# Patient Record
Sex: Female | Born: 1950 | Race: Black or African American | Hispanic: No | Marital: Married | Smoking: Never smoker
Health system: Southern US, Community
[De-identification: ages and names within clinical notes are randomized; demographics above are authoritative.]

## PROBLEM LIST (undated history)

## (undated) DIAGNOSIS — R300 Dysuria: Secondary | ICD-10-CM

## (undated) DIAGNOSIS — E785 Hyperlipidemia, unspecified: Secondary | ICD-10-CM

## (undated) DIAGNOSIS — R319 Hematuria, unspecified: Secondary | ICD-10-CM

## (undated) DIAGNOSIS — E039 Hypothyroidism, unspecified: Secondary | ICD-10-CM

## (undated) DIAGNOSIS — Z9289 Personal history of other medical treatment: Secondary | ICD-10-CM

## (undated) DIAGNOSIS — E079 Disorder of thyroid, unspecified: Secondary | ICD-10-CM

## (undated) DIAGNOSIS — K5900 Constipation, unspecified: Secondary | ICD-10-CM

## (undated) DIAGNOSIS — J40 Bronchitis, not specified as acute or chronic: Secondary | ICD-10-CM

## (undated) DIAGNOSIS — I10 Essential (primary) hypertension: Secondary | ICD-10-CM

## (undated) HISTORY — DX: Bronchitis, not specified as acute or chronic: J40

## (undated) HISTORY — DX: Hematuria, unspecified: R31.9

## (undated) HISTORY — DX: Dysuria: R30.0

## (undated) HISTORY — DX: Constipation, unspecified: K59.00

## (undated) HISTORY — PX: OTHER SURGICAL HISTORY: SHX169

## (undated) HISTORY — PX: TUBAL LIGATION: SHX77

---

## 2001-06-17 ENCOUNTER — Ambulatory Visit (HOSPITAL_COMMUNITY): Admission: RE | Admit: 2001-06-17 | Discharge: 2001-06-17 | Payer: Self-pay | Admitting: Obstetrics and Gynecology

## 2001-06-17 ENCOUNTER — Encounter: Payer: Self-pay | Admitting: Obstetrics and Gynecology

## 2001-08-06 ENCOUNTER — Other Ambulatory Visit: Admission: RE | Admit: 2001-08-06 | Discharge: 2001-08-06 | Payer: Self-pay | Admitting: Obstetrics and Gynecology

## 2002-01-08 ENCOUNTER — Encounter: Payer: Self-pay | Admitting: Internal Medicine

## 2002-01-08 ENCOUNTER — Ambulatory Visit (HOSPITAL_COMMUNITY): Admission: RE | Admit: 2002-01-08 | Discharge: 2002-01-08 | Payer: Self-pay | Admitting: Internal Medicine

## 2002-01-13 ENCOUNTER — Ambulatory Visit (HOSPITAL_COMMUNITY): Admission: RE | Admit: 2002-01-13 | Discharge: 2002-01-13 | Payer: Self-pay | Admitting: Internal Medicine

## 2002-01-13 ENCOUNTER — Encounter: Payer: Self-pay | Admitting: Internal Medicine

## 2002-06-18 ENCOUNTER — Encounter: Payer: Self-pay | Admitting: Internal Medicine

## 2002-06-18 ENCOUNTER — Ambulatory Visit (HOSPITAL_COMMUNITY): Admission: RE | Admit: 2002-06-18 | Discharge: 2002-06-18 | Payer: Self-pay | Admitting: Internal Medicine

## 2003-06-22 ENCOUNTER — Ambulatory Visit (HOSPITAL_COMMUNITY): Admission: RE | Admit: 2003-06-22 | Discharge: 2003-06-22 | Payer: Self-pay | Admitting: Obstetrics and Gynecology

## 2003-06-22 ENCOUNTER — Encounter: Payer: Self-pay | Admitting: Obstetrics and Gynecology

## 2003-09-09 ENCOUNTER — Ambulatory Visit (HOSPITAL_COMMUNITY): Admission: RE | Admit: 2003-09-09 | Discharge: 2003-09-09 | Payer: Self-pay | Admitting: Family Medicine

## 2004-06-23 ENCOUNTER — Ambulatory Visit (HOSPITAL_COMMUNITY): Admission: RE | Admit: 2004-06-23 | Discharge: 2004-06-23 | Payer: Self-pay | Admitting: Family Medicine

## 2005-01-04 ENCOUNTER — Ambulatory Visit (HOSPITAL_COMMUNITY): Admission: RE | Admit: 2005-01-04 | Discharge: 2005-01-04 | Payer: Self-pay | Admitting: General Surgery

## 2005-06-30 ENCOUNTER — Ambulatory Visit (HOSPITAL_COMMUNITY): Admission: RE | Admit: 2005-06-30 | Discharge: 2005-06-30 | Payer: Self-pay | Admitting: Family Medicine

## 2006-07-03 ENCOUNTER — Ambulatory Visit (HOSPITAL_COMMUNITY): Admission: RE | Admit: 2006-07-03 | Discharge: 2006-07-03 | Payer: Self-pay | Admitting: Family Medicine

## 2006-12-13 ENCOUNTER — Ambulatory Visit (HOSPITAL_COMMUNITY): Admission: RE | Admit: 2006-12-13 | Discharge: 2006-12-13 | Payer: Self-pay | Admitting: Obstetrics & Gynecology

## 2007-07-08 ENCOUNTER — Ambulatory Visit (HOSPITAL_COMMUNITY): Admission: RE | Admit: 2007-07-08 | Discharge: 2007-07-08 | Payer: Self-pay | Admitting: Family Medicine

## 2008-07-07 ENCOUNTER — Ambulatory Visit (HOSPITAL_COMMUNITY): Admission: RE | Admit: 2008-07-07 | Discharge: 2008-07-07 | Payer: Self-pay | Admitting: Family Medicine

## 2008-12-28 ENCOUNTER — Other Ambulatory Visit: Admission: RE | Admit: 2008-12-28 | Discharge: 2008-12-28 | Payer: Self-pay | Admitting: Obstetrics and Gynecology

## 2009-07-14 ENCOUNTER — Ambulatory Visit (HOSPITAL_COMMUNITY): Admission: RE | Admit: 2009-07-14 | Discharge: 2009-07-14 | Payer: Self-pay | Admitting: Family Medicine

## 2010-07-15 ENCOUNTER — Ambulatory Visit (HOSPITAL_COMMUNITY): Admission: RE | Admit: 2010-07-15 | Discharge: 2010-07-15 | Payer: Self-pay | Admitting: Family Medicine

## 2011-06-30 ENCOUNTER — Other Ambulatory Visit (HOSPITAL_COMMUNITY): Payer: Self-pay | Admitting: Family Medicine

## 2011-06-30 DIAGNOSIS — Z139 Encounter for screening, unspecified: Secondary | ICD-10-CM

## 2011-07-17 ENCOUNTER — Ambulatory Visit (HOSPITAL_COMMUNITY)
Admission: RE | Admit: 2011-07-17 | Discharge: 2011-07-17 | Disposition: A | Discharge status: Home / Self Care | Payer: Self-pay | Source: Ambulatory Visit | Attending: Family Medicine | Admitting: Family Medicine

## 2011-07-17 DIAGNOSIS — Z139 Encounter for screening, unspecified: Secondary | ICD-10-CM

## 2011-11-01 ENCOUNTER — Emergency Department (HOSPITAL_COMMUNITY): Payer: Self-pay

## 2011-11-01 ENCOUNTER — Encounter: Payer: Self-pay | Admitting: Oncology

## 2011-11-01 ENCOUNTER — Emergency Department (HOSPITAL_COMMUNITY)
Admission: EM | Admit: 2011-11-01 | Discharge: 2011-11-01 | Disposition: A | Discharge status: Home / Self Care | Payer: Self-pay | Attending: Emergency Medicine | Admitting: Emergency Medicine

## 2011-11-01 DIAGNOSIS — Q676 Pectus excavatum: Secondary | ICD-10-CM | POA: Insufficient documentation

## 2011-11-01 DIAGNOSIS — Z79899 Other long term (current) drug therapy: Secondary | ICD-10-CM | POA: Insufficient documentation

## 2011-11-01 DIAGNOSIS — I10 Essential (primary) hypertension: Secondary | ICD-10-CM | POA: Insufficient documentation

## 2011-11-01 DIAGNOSIS — E079 Disorder of thyroid, unspecified: Secondary | ICD-10-CM | POA: Insufficient documentation

## 2011-11-01 DIAGNOSIS — E785 Hyperlipidemia, unspecified: Secondary | ICD-10-CM | POA: Insufficient documentation

## 2011-11-01 DIAGNOSIS — W108XXA Fall (on) (from) other stairs and steps, initial encounter: Secondary | ICD-10-CM | POA: Insufficient documentation

## 2011-11-01 HISTORY — DX: Hyperlipidemia, unspecified: E78.5

## 2011-11-01 HISTORY — DX: Essential (primary) hypertension: I10

## 2011-11-01 HISTORY — DX: Disorder of thyroid, unspecified: E07.9

## 2011-11-01 HISTORY — DX: Personal history of other medical treatment: Z92.89

## 2011-11-01 NOTE — ED Notes (Addendum)
Pt reports x 2 weeks she has had a palpable mass b/w her breasts - seen by pcp for same and was told it was "her bone."  Pt reports pain worsens w/ movement and radiates through to her shoulder blades.  Also reports abdominal pain.

## 2011-11-01 NOTE — ED Provider Notes (Signed)
History     CSN: [id number]  Arrival date & time 11/01/11  1137   First MD Initiated Contact with Patient 11/01/11 1304      Chief Complaint  Patient presents with  . Knot on sternum     (Consider location/radiation/quality/duration/timing/severity/associated sxs/prior treatment) HPI  Patient relates in the first part of November she fell about 4 steps landing on her knees. She denies hitting her chest or having any chest discomfort after that. She also relates she helped her husband unload a load of wood from a truck and she's been carrying wood into the house for their wood stove. She states 2 or 3 weeks ago she noticed a lump in her central lower chest that is nonpainful and nontender to palpation. Nothing makes it hurt and nothing makes it go away. She denies any cough or fever. She states she was seen last week by her physician and was told it was a bulb. She relates however today she thought it seemed a little more swollen and presents to the emergency department should states she's afraid it is a sign of  a heart problem when I tried to reassure her that this was not breast cancer.  PCP Dr. Delbert Harness  Past Medical History  Diagnosis Date  . Hypertension   . Thyroid disease   . Hyperlipidemia   . History of mammogram     Past Surgical History  Procedure Date  . Exploratory laparotomy   . Tubal ligation     No family history on file.  History  Substance Use Topics  . Smoking status: Never Smoker   . Smokeless tobacco: Not on file  . Alcohol Use: No   lives with spouse Unemployed  OB History    Grav Para Term Preterm Abortions TAB SAB Ect Mult Living                  Review of Systems  All other systems reviewed and are negative.    Allergies  Fish allergy and Other  Home Medications   Current Outpatient Rx  Name Route Sig Dispense Refill  . ATENOLOL 50 MG PO TABS Oral Take 50 mg by mouth daily.      . IBUPROFEN 200 MG PO TABS Oral Take 400 mg by  mouth every 6 (six) hours as needed. Pain     . LEVOTHYROXINE SODIUM 50 MCG PO TABS Oral Take 50 mcg by mouth daily.      Marland Kitchen LISINOPRIL-HYDROCHLOROTHIAZIDE 20-12.5 MG PO TABS Oral Take 1 tablet by mouth daily.      Marland Kitchen SIMVASTATIN 40 MG PO TABS Oral Take 40 mg by mouth at bedtime.      Marland Kitchen VERAPAMIL HCL 240 MG PO TBCR Oral Take 240 mg by mouth daily.        BP 138/72  Pulse 54  Temp 98.3 F (36.8 C)  Resp 16  Ht 5\' 3"  (1.6 m)  Wt 141 lb (63.957 kg)  BMI 24.98 kg/m2  SpO2 100%  LMP 10/31/2009  Vital signs normal except for bradycardia consistent with her medications   Physical Exam  Nursing note and vitals reviewed. Constitutional: She is oriented to person, place, and time. She appears well-developed and well-nourished.  Non-toxic appearance. She does not appear ill. No distress.  HENT:  Head: Normocephalic and atraumatic.  Right Ear: External ear normal.  Left Ear: External ear normal.  Nose: Nose normal. No mucosal edema or rhinorrhea.  Mouth/Throat: Oropharynx is clear and moist and mucous  membranes are normal. No dental abscesses or uvula swelling.  Eyes: Conjunctivae and EOM are normal. Pupils are equal, round, and reactive to light.  Neck: Normal range of motion and full passive range of motion without pain. Neck supple.  Cardiovascular: Normal rate, regular rhythm and normal heart sounds.  Exam reveals no gallop and no friction rub.   No murmur heard. Pulmonary/Chest: Effort normal and breath sounds normal. No respiratory distress. She has no wheezes. She has no rhonchi. She has no rales. She exhibits no tenderness and no crepitus.       Patient seems to indicate her xiphoid is in the area she is concerned about. There may be a small amount of swelling over it however the skin is not red, there is no induration, skin lesions seen. She is nontender to palpation  Abdominal: Soft. Normal appearance and bowel sounds are normal. She exhibits no distension. There is no tenderness.  There is no rebound and no guarding.  Musculoskeletal: Normal range of motion. She exhibits no edema and no tenderness.       Moves all extremities well.  Neurological: She is alert and oriented to person, place, and time. She has normal strength. No cranial nerve deficit.  Skin: Skin is warm, dry and intact. No rash noted. No erythema. No pallor.  Psychiatric: She has a normal mood and affect. Her speech is normal and behavior is normal. Her mood appears not anxious.    ED Course  Procedures (including critical care time)  Dg Sternum  11/01/2011  *RADIOLOGY REPORT*  Clinical Data: Prominent xyphoid process.  Clinical concern for a possible fracture.  STERNUM - 2+ VIEW  Comparison: Chest CT dated 01/13/2002.  Findings: Mild pectus excavatum inferiorly.  No fracture or subluxation seen.  Normal sized heart and clear lungs.  Mild peribronchial thickening.  IMPRESSION:  1.  Mild pectus excavatum without fracture. 2.  Mild bronchitic changes.  Original Report Authenticated By: Darrol Angel, M.D.   Diagnoses that have been ruled out:  Diagnoses that are still under consideration:  Final diagnoses:  Pectus excavatum   Plan discharge with reassurance  Devoria Albe, MD, FACEP    MDM          Ward Givens, MD 11/01/11 1450

## 2012-08-15 ENCOUNTER — Other Ambulatory Visit (HOSPITAL_COMMUNITY): Payer: Self-pay | Admitting: Family Medicine

## 2012-08-15 DIAGNOSIS — Z1231 Encounter for screening mammogram for malignant neoplasm of breast: Secondary | ICD-10-CM

## 2012-08-20 ENCOUNTER — Ambulatory Visit (HOSPITAL_COMMUNITY)
Admission: RE | Admit: 2012-08-20 | Discharge: 2012-08-20 | Disposition: A | Discharge status: Home / Self Care | Payer: Self-pay | Source: Ambulatory Visit | Attending: Family Medicine | Admitting: Family Medicine

## 2012-08-20 DIAGNOSIS — Z1231 Encounter for screening mammogram for malignant neoplasm of breast: Secondary | ICD-10-CM

## 2013-04-08 ENCOUNTER — Ambulatory Visit (INDEPENDENT_AMBULATORY_CARE_PROVIDER_SITE_OTHER): Payer: Self-pay | Admitting: Obstetrics & Gynecology

## 2013-04-08 ENCOUNTER — Encounter: Payer: Self-pay | Admitting: Obstetrics & Gynecology

## 2013-04-08 VITALS — BP 100/70 | Ht 63.0 in | Wt 144.0 lb

## 2013-04-08 DIAGNOSIS — Z01419 Encounter for gynecological examination (general) (routine) without abnormal findings: Secondary | ICD-10-CM

## 2013-04-08 DIAGNOSIS — I10 Essential (primary) hypertension: Secondary | ICD-10-CM

## 2013-04-08 DIAGNOSIS — Z1212 Encounter for screening for malignant neoplasm of rectum: Secondary | ICD-10-CM

## 2013-04-08 NOTE — Progress Notes (Signed)
Patient ID: Jenna Wade, female   DOB: 1951-08-18, 61 y.o.   MRN: [id number] Subjective:     Jenna Wade is a 62 y.o. female here for a routine exam.  Patient's last menstrual period was 10/31/2009. No obstetric history on file. Current complaints: none.  Personal health questionnaire reviewed: no.   Gynecologic History Patient's last menstrual period was 10/31/2009. Contraception: post menopausal status Last Pap: 2013. Results were: normal Last mammogram: 2013. Results were: normal  Obstetric History OB History   Grav Para Term Preterm Abortions TAB SAB Ect Mult Living                   The following portions of the patient's history were reviewed and updated as appropriate: allergies, current medications, past family history, past medical history, past social history, past surgical history and problem list.  Review of Systems  Review of Systems  Constitutional: Negative for fever, chills, weight loss, malaise/fatigue and diaphoresis.  HENT: Negative for hearing loss, ear pain, nosebleeds, congestion, sore throat, neck pain, tinnitus and ear discharge.   Eyes: Negative for blurred vision, double vision, photophobia, pain, discharge and redness.  Respiratory: Negative for cough, hemoptysis, sputum production, shortness of breath, wheezing and stridor.   Cardiovascular: Negative for chest pain, palpitations, orthopnea, claudication, leg swelling and PND.  Gastrointestinal: negative for abdominal pain. Negative for heartburn, nausea, vomiting, diarrhea, constipation, blood in stool and melena.  Genitourinary: Negative for dysuria, urgency, frequency, hematuria and flank pain.  Musculoskeletal: Negative for myalgias, back pain, joint pain and falls.  Skin: Negative for itching and rash.  Neurological: Negative for dizziness, tingling, tremors, sensory change, speech change, focal weakness, seizures, loss of consciousness, weakness and headaches.  Endo/Heme/Allergies: Negative for  environmental allergies and polydipsia. Does not bruise/bleed easily.  Psychiatric/Behavioral: Negative for depression, suicidal ideas, hallucinations, memory loss and substance abuse. The patient is not nervous/anxious and does not have insomnia.        Objective:    Physical Exam  Vitals reviewed. Constitutional: She is oriented to person, place, and time. She appears well-developed and well-nourished.  HENT:  Head: Normocephalic and atraumatic.        Right Ear: External ear normal.  Left Ear: External ear normal.  Nose: Nose normal.  Mouth/Throat: Oropharynx is clear and moist.  Eyes: Conjunctivae and EOM are normal. Pupils are equal, round, and reactive to light. Right eye exhibits no discharge. Left eye exhibits no discharge. No scleral icterus.  Neck: Normal range of motion. Neck supple. No tracheal deviation present. No thyromegaly present.  Cardiovascular: Normal rate, regular rhythm, normal heart sounds and intact distal pulses.  Exam reveals no gallop and no friction rub.   No murmur heard. Respiratory: Effort normal and breath sounds normal. No respiratory distress. She has no wheezes. She has no rales. She exhibits no tenderness.  GI: Soft. Bowel sounds are normal. She exhibits no distension and no mass. There is no tenderness. There is no rebound and no guarding.  Genitourinary:       Vulva is normal without lesions Vagina is pink moist without discharge Cervix normal in appearance and pap is done Uterus is normal size shape and contour Adnexa is negative with normal sized ovaries   Musculoskeletal: Normal range of motion. She exhibits no edema and no tenderness.  Neurological: She is alert and oriented to person, place, and time. She has normal reflexes. She displays normal reflexes. No cranial nerve deficit. She exhibits normal muscle tone. Coordination normal.  Skin:  Skin is warm and dry. No rash noted. No erythema. No pallor.  Psychiatric: She has a normal mood and  affect. Her behavior is normal. Judgment and thought content normal.       Assessment:    Healthy female exam.    Plan:    Mammogram ordered. Follow up in: 1 year.

## 2013-07-29 ENCOUNTER — Encounter: Payer: Self-pay | Admitting: Women's Health

## 2013-07-29 ENCOUNTER — Ambulatory Visit (INDEPENDENT_AMBULATORY_CARE_PROVIDER_SITE_OTHER): Payer: Self-pay | Admitting: Women's Health

## 2013-07-29 VITALS — BP 120/80 | Ht 63.0 in | Wt 144.0 lb

## 2013-07-29 DIAGNOSIS — N390 Urinary tract infection, site not specified: Secondary | ICD-10-CM

## 2013-07-29 DIAGNOSIS — B373 Candidiasis of vulva and vagina: Secondary | ICD-10-CM

## 2013-07-29 MED ORDER — NITROFURANTOIN MONOHYD MACRO 100 MG PO CAPS: 100.0000 mg | ORAL_CAPSULE | Freq: Two times a day (BID) | ORAL | Status: DC

## 2013-07-29 NOTE — Patient Instructions (Addendum)
Urinary Tract Infection  Urinary tract infections (UTIs) can develop anywhere along your urinary tract. Your urinary tract is your body's drainage system for removing wastes and extra water. Your urinary tract includes two kidneys, two ureters, a bladder, and a urethra. Your kidneys are a pair of bean-shaped organs. Each kidney is about the size of your fist. They are located below your ribs, one on each side of your spine.  CAUSES  Infections are caused by microbes, which are microscopic organisms, including fungi, viruses, and bacteria. These organisms are so small that they can only be seen through a microscope. Bacteria are the microbes that most commonly cause UTIs.  SYMPTOMS   Symptoms of UTIs may vary by age and gender of the patient and by the location of the infection. Symptoms in young women typically include a frequent and intense urge to urinate and a painful, burning feeling in the bladder or urethra during urination. Older women and men are more likely to be tired, shaky, and weak and have muscle aches and abdominal pain. A fever may mean the infection is in your kidneys. Other symptoms of a kidney infection include pain in your back or sides below the ribs, nausea, and vomiting.  DIAGNOSIS  To diagnose a UTI, your caregiver will ask you about your symptoms. Your caregiver also will ask to provide a urine sample. The urine sample will be tested for bacteria and white blood cells. White blood cells are made by your body to help fight infection.  TREATMENT   Typically, UTIs can be treated with medication. Because most UTIs are caused by a bacterial infection, they usually can be treated with the use of antibiotics. The choice of antibiotic and length of treatment depend on your symptoms and the type of bacteria causing your infection.  HOME CARE INSTRUCTIONS   If you were prescribed antibiotics, take them exactly as your caregiver instructs you. Finish the medication even if you feel better after you  have only taken some of the medication.   Drink enough water and fluids to keep your urine clear or pale yellow.   Avoid caffeine, tea, and carbonated beverages. They tend to irritate your bladder.   Empty your bladder often. Avoid holding urine for long periods of time.   Empty your bladder before and after sexual intercourse.   After a bowel movement, women should cleanse from front to back. Use each tissue only once.  SEEK MEDICAL CARE IF:    You have back pain.   You develop a fever.   Your symptoms do not begin to resolve within 3 days.  SEEK IMMEDIATE MEDICAL CARE IF:    You have severe back pain or lower abdominal pain.   You develop chills.   You have nausea or vomiting.   You have continued burning or discomfort with urination.  MAKE SURE YOU:    Understand these instructions.   Will watch your condition.   Will get help right away if you are not doing well or get worse.  Document Released: 07/26/2005 Document Revised: 04/16/2012 Document Reviewed: 11/24/2011  Exit[employer] Patient Information 2014 [employer].

## 2013-07-29 NOTE — Progress Notes (Signed)
Patient ID: Jenna Wade, female   DOB: 10-Jun-1951, 62 y.o.   MRN: [id number] Pt states she has an odor when she voids. Pt also states that she has noticed a tingling sensation as well. Pt denies any discharge or irritation. Pt missed the cup when trying to get a sample, unable to check urine.

## 2013-07-29 NOTE — Addendum Note (Signed)
Addended by: Shawna Clamp R on: 07/29/2013 12:09 PM   Modules accepted: Orders

## 2013-07-29 NOTE — Progress Notes (Signed)
Patient ID: Jenna Wade, female   DOB: 08-25-1951, 62 y.o.   MRN: [id number] Jenna Wade is a 62 y.o. African-American female who reports low back pain when voiding, odor to urine, and tingling sensation after voiding x 4d. States she has had a UTI in the past, and this feels the same.  Denies fever/chills, flank pain, abdominal pain, abnormal/malodorous vag d/c, or vulvovaginal itching/irritation.  States she usually gets yeast infections from antibiotics.   O: BP 120/80  Ht 5\' 3"  (1.6 m)  Wt 144 lb (65.318 kg)  BMI 25.51 kg/m2  LMP 10/31/2009      Urine w/ + nitrites, not yet entered into EPIC by nurse  A: UTI  P: Rx Macrobid BID x 7d     Send urine for UA, C&S     Reviewed UTI prevention/relief measures     Unable to rx diflucan d/t contraindication w/ simvastatin, pt to eat lots of yogurt w/ live cultures, and use OTC yeast   infection cream if needed  Cheral Marker, CNM, [facility] 07/29/2013 11:49 AM

## 2013-07-30 ENCOUNTER — Other Ambulatory Visit (HOSPITAL_COMMUNITY): Payer: Self-pay | Admitting: Family Medicine

## 2013-07-30 DIAGNOSIS — Z1231 Encounter for screening mammogram for malignant neoplasm of breast: Secondary | ICD-10-CM

## 2013-07-30 LAB — URINALYSIS
Ketones, ur: NEGATIVE mg/dL
Nitrite: POSITIVE — AB
Specific Gravity, Urine: <1.005 — ABNORMAL LOW (ref 1.005–1.030)
Urobilinogen, UA: 0.2 mg/dL (ref 0.0–1.0)

## 2013-07-31 LAB — URINE CULTURE

## 2013-08-11 ENCOUNTER — Ambulatory Visit (HOSPITAL_COMMUNITY)
Admission: RE | Admit: 2013-08-11 | Discharge: 2013-08-11 | Disposition: A | Discharge status: Home / Self Care | Payer: Self-pay | Source: Ambulatory Visit | Attending: Family Medicine | Admitting: Family Medicine

## 2013-08-11 DIAGNOSIS — Z1231 Encounter for screening mammogram for malignant neoplasm of breast: Secondary | ICD-10-CM

## 2014-01-31 ENCOUNTER — Emergency Department (HOSPITAL_COMMUNITY)
Admission: EM | Admit: 2014-01-31 | Discharge: 2014-01-31 | Disposition: A | Discharge status: Home / Self Care | Payer: BC Managed Care – PPO | Attending: Emergency Medicine | Admitting: Emergency Medicine

## 2014-01-31 ENCOUNTER — Encounter (HOSPITAL_COMMUNITY): Payer: Self-pay | Admitting: Emergency Medicine

## 2014-01-31 ENCOUNTER — Emergency Department (HOSPITAL_COMMUNITY): Payer: BC Managed Care – PPO

## 2014-01-31 DIAGNOSIS — E785 Hyperlipidemia, unspecified: Secondary | ICD-10-CM | POA: Insufficient documentation

## 2014-01-31 DIAGNOSIS — H9209 Otalgia, unspecified ear: Secondary | ICD-10-CM | POA: Insufficient documentation

## 2014-01-31 DIAGNOSIS — J40 Bronchitis, not specified as acute or chronic: Secondary | ICD-10-CM

## 2014-01-31 DIAGNOSIS — J029 Acute pharyngitis, unspecified: Secondary | ICD-10-CM

## 2014-01-31 DIAGNOSIS — E079 Disorder of thyroid, unspecified: Secondary | ICD-10-CM | POA: Insufficient documentation

## 2014-01-31 DIAGNOSIS — I10 Essential (primary) hypertension: Secondary | ICD-10-CM | POA: Insufficient documentation

## 2014-01-31 DIAGNOSIS — Z79899 Other long term (current) drug therapy: Secondary | ICD-10-CM | POA: Insufficient documentation

## 2014-01-31 DIAGNOSIS — J209 Acute bronchitis, unspecified: Secondary | ICD-10-CM | POA: Insufficient documentation

## 2014-01-31 MED ORDER — AZITHROMYCIN 250 MG PO TABS: ORAL_TABLET | ORAL | Status: DC

## 2014-01-31 MED ORDER — PHENYLEPH-PROMETHAZINE-COD 5-6.25-10 MG/5ML PO SYRP: 5.0000 mL | ORAL_SOLUTION | Freq: Four times a day (QID) | ORAL | Status: DC | PRN

## 2014-01-31 NOTE — Discharge Instructions (Signed)
Bronchitis °Bronchitis is swelling (inflammation) of the air tubes leading to your lungs (bronchi). This causes mucus and a cough. If the swelling gets bad, you may have trouble breathing. °HOME CARE  °· Rest. °· Drink enough fluids to keep your pee (urine) clear or pale yellow (unless you have a condition where you have to watch how much you drink). °· Only take medicine as told by your doctor. If you were given antibiotic medicines, finish them even if you start to feel better. °· Avoid smoke, irritating chemicals, and strong smells. These make the problem worse. Quit smoking if you smoke. This helps your lungs heal faster. °· Use a cool mist humidifier. Change the water in the humidifier every day. You can also sit in the bathroom with hot shower running for 5 10 minutes. Keep the door closed. °· See your health care provider as told. °· Wash your hands often. °GET HELP IF: °Your problems do not get better after 1 week. °GET HELP RIGHT AWAY IF:  °· Your fever gets worse. °· You have chills. °· Your chest hurts. °· Your problems breathing get worse. °· You have blood in your mucus. °· You pass out (faint). °· You feel lightheaded. °· You have a bad headache. °· You throw up (vomit) again and again. °MAKE SURE YOU: °· Understand these instructions. °· Will watch your condition. °· Will get help right away if you are not doing well or get worse. °Document Released: 04/03/2008 Document Revised: 08/06/2013 Document Reviewed: 06/10/2013 °Exit[employer]® Patient Information ©2014 [employer]. ° °

## 2014-01-31 NOTE — ED Notes (Signed)
Pt c/o sore throat and cough x1 week. Cough is non-productive.

## 2014-01-31 NOTE — ED Provider Notes (Signed)
CSN: [id number]     Arrival date & time 01/31/14  1137 History   First MD Initiated Contact with Patient 01/31/14 1257     Chief Complaint  Patient presents with  . Sore Throat  . Cough     (Consider location/radiation/quality/duration/timing/severity/associated sxs/prior Treatment) Patient is a 63 y.o. female presenting with pharyngitis and cough. The history is provided by the patient.  Sore Throat This is a new problem. The current episode started in the past 7 days. The problem occurs constantly. The problem has been unchanged. Associated symptoms include congestion, coughing and a sore throat. Pertinent negatives include no anorexia, change in bowel habit, chills, fever, headaches, myalgias, nausea, numbness, rash, swollen glands, vomiting or weakness. The symptoms are aggravated by swallowing. She has tried acetaminophen for the symptoms. The treatment provided no relief.  Cough Cough characteristics:  Non-productive and dry Severity:  Moderate Onset quality:  Gradual Duration:  1 week Progression:  Worsening Chronicity:  New Smoker: no   Relieved by:  Nothing Worsened by:  Activity and lying down Associated symptoms: ear pain, sinus congestion and sore throat   Associated symptoms: no chills, no fever, no headaches, no myalgias and no rash     Past Medical History  Diagnosis Date  . Hypertension   . Thyroid disease   . Hyperlipidemia   . History of mammogram    Past Surgical History  Procedure Laterality Date  . Exploratory laparotomy    . Tubal ligation     Family History  Problem Relation Age of Onset  . Hypertension Mother   . Hyperlipidemia Mother   . Hypertension Father   . Heart disease Father   . Heart attack Father   . Hypertension Sister   . Hypertension Daughter   . Hypertension Son   . Hypertension Brother   . Hypertension Sister   . Hypertension Sister   . Hypertension Sister   . Hypertension Sister    History  Substance Use Topics  .  Smoking status: Never Smoker   . Smokeless tobacco: Never Used  . Alcohol Use: No   OB History   Grav Para Term Preterm Abortions TAB SAB Ect Mult Living                 Review of Systems  Constitutional: Negative for fever and chills.  HENT: Positive for congestion, ear pain, sinus pressure and sore throat. Negative for trouble swallowing.   Eyes: Negative for pain and visual disturbance.  Respiratory: Positive for cough. Negative for chest tightness.   Cardiovascular: Negative for leg swelling.  Gastrointestinal: Negative for nausea, vomiting, anorexia and change in bowel habit.  Genitourinary: Negative for dysuria, urgency and frequency.  Musculoskeletal: Negative for myalgias.  Skin: Negative for rash.  Neurological: Negative for weakness, numbness and headaches.  Psychiatric/Behavioral: Negative for confusion.      Allergies  Fish allergy and Other  Home Medications   Current Outpatient Rx  Name  Route  Sig  Dispense  Refill  . atenolol (TENORMIN) 50 MG tablet   Oral   Take 50 mg by mouth daily.           Marland Kitchen ibuprofen (ADVIL,MOTRIN) 200 MG tablet   Oral   Take 400 mg by mouth every 6 (six) hours as needed. Pain          . levothyroxine (SYNTHROID, LEVOTHROID) 50 MCG tablet   Oral   Take 50 mcg by mouth daily.           Marland Kitchen  lisinopril-hydrochlorothiazide (PRINZIDE,ZESTORETIC) 20-12.5 MG per tablet   Oral   Take 1 tablet by mouth daily.           . nitrofurantoin, macrocrystal-monohydrate, (MACROBID) 100 MG capsule   Oral   Take 1 capsule (100 mg total) by mouth 2 (two) times daily. X 7 days   14 capsule   0   . simvastatin (ZOCOR) 40 MG tablet   Oral   Take 40 mg by mouth at bedtime.           . verapamil (CALAN-SR) 240 MG CR tablet   Oral   Take 240 mg by mouth daily.            BP 133/71  Pulse 67  Temp(Src) 98.2 F (36.8 C) (Oral)  Resp 18  Ht 5\' 2"  (1.575 m)  Wt 143 lb (64.864 kg)  BMI 26.15 kg/m2  SpO2 98%  LMP  10/31/2009 Physical Exam  Nursing note and vitals reviewed. Constitutional: She is oriented to person, place, and time. She appears well-developed and well-nourished. No distress.  HENT:  Head: Normocephalic.  Right Ear: Tympanic membrane is erythematous.  Left Ear: Tympanic membrane is erythematous.  Nose: Mucosal edema and rhinorrhea present.  Mouth/Throat: Uvula is midline and mucous membranes are normal. Posterior oropharyngeal erythema present.  Eyes: EOM are normal.  Neck: Neck supple.  Cardiovascular: Normal rate and regular rhythm.   Pulmonary/Chest: Effort normal. She has no wheezes. She has no rales.  Musculoskeletal: Normal range of motion.  Lymphadenopathy:    She has cervical adenopathy.  Neurological: She is alert and oriented to person, place, and time. No cranial nerve deficit.  Skin: Skin is warm and dry.  Psychiatric: She has a normal mood and affect. Her behavior is normal.    ED Course  Procedures Dg Chest 2 View  01/31/2014   CLINICAL DATA:  cough x 1 week  EXAM: CHEST  2 VIEW  COMPARISON:  DG STERNUM dated 11/01/2011; CT CHEST W/CM dated 01/13/2002  FINDINGS: Low lung volumes. The heart size and mediastinal contours are within normal limits. Both lungs are clear. The visualized skeletal structures are unremarkable.  IMPRESSION: No active cardiopulmonary disease.   Electronically Signed   By: Margaree Mackintosh M.D.   On: 01/31/2014 13:40    MDM  63 y.o. female with cough, sore throat and ear pain x 1 week. Stable for discharge without pneumonia or signs of sepsis. Will treat for ear pain and cough. She will follow up with her PCP or return here for worsening symptoms.    Medication List    TAKE these medications       azithromycin 250 MG tablet  Commonly known as:  ZITHROMAX  Take 2 tablets PO today and then one tablet daily     Phenyleph-Promethazine-Cod 5-6.25-10 MG/5ML Syrp  Take 5 mLs by mouth every 6 (six) hours as needed.      ASK your doctor about these  medications       atenolol 50 MG tablet  Commonly known as:  TENORMIN  Take 50 mg by mouth daily.     ibuprofen 200 MG tablet  Commonly known as:  ADVIL,MOTRIN  Take 400 mg by mouth every 6 (six) hours as needed. Pain     levothyroxine 50 MCG tablet  Commonly known as:  SYNTHROID, LEVOTHROID  Take 50 mcg by mouth daily.     lisinopril-hydrochlorothiazide 20-12.5 MG per tablet  Commonly known as:  PRINZIDE,ZESTORETIC  Take 1 tablet by mouth daily.  nitrofurantoin (macrocrystal-monohydrate) 100 MG capsule  Commonly known as:  MACROBID  Take 1 capsule (100 mg total) by mouth 2 (two) times daily. X 7 days     simvastatin 40 MG tablet  Commonly known as:  ZOCOR  Take 40 mg by mouth at bedtime.     verapamil 240 MG CR tablet  Commonly known as:  CALAN-SR  Take 240 mg by mouth daily.           [place], [place] 01/31/14 1743

## 2014-02-01 NOTE — ED Provider Notes (Signed)
Medical screening examination/treatment/procedure(s) were performed by non-physician practitioner and as supervising physician I was immediately available for consultation/collaboration.   EKG Interpretation None       Jasper Riling. Alvino Chapel, MD 02/01/14 1117

## 2014-04-21 ENCOUNTER — Encounter: Payer: Self-pay | Admitting: Gastroenterology

## 2014-04-21 ENCOUNTER — Ambulatory Visit (INDEPENDENT_AMBULATORY_CARE_PROVIDER_SITE_OTHER): Payer: BC Managed Care – PPO | Admitting: Adult Health

## 2014-04-21 ENCOUNTER — Encounter: Payer: Self-pay | Admitting: Adult Health

## 2014-04-21 VITALS — BP 116/60 | HR 76 | Ht 63.0 in | Wt 145.0 lb

## 2014-04-21 DIAGNOSIS — K5900 Constipation, unspecified: Secondary | ICD-10-CM

## 2014-04-21 DIAGNOSIS — Z01419 Encounter for gynecological examination (general) (routine) without abnormal findings: Secondary | ICD-10-CM

## 2014-04-21 DIAGNOSIS — Z1212 Encounter for screening for malignant neoplasm of rectum: Secondary | ICD-10-CM

## 2014-04-21 DIAGNOSIS — Z139 Encounter for screening, unspecified: Secondary | ICD-10-CM

## 2014-04-21 HISTORY — DX: Constipation, unspecified: K59.00

## 2014-04-21 LAB — HEMOCCULT GUIAC POC 1CARD (OFFICE): Fecal Occult Blood, POC: NEGATIVE

## 2014-04-21 NOTE — Progress Notes (Signed)
Patient ID: Jenna Wade, female   DOB: 23-Jul-1951, 63 y.o.   MRN: [id number] History of Present Illness: Jenna Wade is a 62 year old black female, married,in for a physical.she had a normal pap with negative HPV 04/08/13.   Current Medications, Allergies, Past Medical History, Past Surgical History, Family History and Social History were reviewed in [employer] record.     Review of Systems: Patient denies any headaches, blurred vision, shortness of breath, chest pain, abdominal pain, problems with  urination, or intercourse. No joint pain or mood swings, but has constipation and uses mira lax some.    Physical Exam:BP 116/60  Pulse 76  Ht 5\' 3"  (1.6 m)  Wt 145 lb (65.772 kg)  BMI 25.69 kg/m2  LMP 10/31/2009 General:  Well developed, well nourished, no acute distress Skin:  Warm and dry Neck:  Midline trachea, normal thyroid Lungs; Clear to auscultation bilaterally Breast:  No dominant palpable mass, retraction, or nipple discharge Cardiovascular: Regular rate and rhythm Abdomen:  Soft, non tender, no hepatosplenomegaly, has lipoma near left rib edge, non tender Pelvic:  External genitalia is normal in appearance.  The vagina has decrease color and moisture with loss of rugae. The cervix is smooth.  Uterus is felt to be normal size, shape, and contour.  No                adnexal masses or tenderness noted. Rectal: Good sphincter tone, no polyps,internal hemorrhoids felt.  Hemoccult negative. Extremities:  No swelling or varicosities noted Psych:  No mood changes,alert and cooperative,seems happy Discussed that meds for BP and thyroid can contribute to constipation issues.She had BE and colonoscopy about 14 years ago.  Impression: Yearly gyn exam no pap Constipation    Plan: Physical in 1 year Mammogram yearly Labs with PCP Referred to Dr Oneida Alar for colonoscopy Try prunes or prune juice with mira lax

## 2014-04-21 NOTE — Patient Instructions (Signed)
Physical in 1 year Mammogram yearly  Colonoscopy advised refer to Dr Learta Codding with PCP Constipation Constipation is when a person has fewer than three bowel movements a week, has difficulty having a bowel movement, or has stools that are dry, hard, or larger than normal. As people grow older, constipation is more common. If you try to fix constipation with medicines that make you have a bowel movement (laxatives), the problem may get worse. Long-term laxative use may cause the muscles of the colon to become weak. A low-fiber diet, not taking in enough fluids, and taking certain medicines may make constipation worse.  CAUSES   Certain medicines, such as antidepressants, pain medicine, iron supplements, antacids, and water pills.   Certain diseases, such as diabetes, irritable bowel syndrome (IBS), thyroid disease, or depression.   Not drinking enough water.   Not eating enough fiber-rich foods.   Stress or travel.   Lack of physical activity or exercise.   Ignoring the urge to have a bowel movement.   Using laxatives too much.  SIGNS AND SYMPTOMS   Having fewer than three bowel movements a week.   Straining to have a bowel movement.   Having stools that are hard, dry, or larger than normal.   Feeling full or bloated.   Pain in the lower abdomen.   Not feeling relief after having a bowel movement.  DIAGNOSIS  Your health care Lusine Corlett will take a medical history and perform a physical exam. Further testing may be done for severe constipation. Some tests may include:  A barium enema X-ray to examine your rectum, colon, and, sometimes, your small intestine.   A sigmoidoscopy to examine your lower colon.   A colonoscopy to examine your entire colon. TREATMENT  Treatment will depend on the severity of your constipation and what is causing it. Some dietary treatments include drinking more fluids and eating more fiber-rich foods. Lifestyle treatments may  include regular exercise. If these diet and lifestyle recommendations do not help, your health care Ajay Strubel may recommend taking over-the-counter laxative medicines to help you have bowel movements. Prescription medicines may be prescribed if over-the-counter medicines do not work.  HOME CARE INSTRUCTIONS   Eat foods that have a lot of fiber, such as fruits, vegetables, whole grains, and beans.  Limit foods high in fat and processed sugars, such as french fries, hamburgers, cookies, candies, and soda.   A fiber supplement may be added to your diet if you cannot get enough fiber from foods.   Drink enough fluids to keep your urine clear or pale yellow.   Exercise regularly or as directed by your health care Katlin Ciszewski.   Go to the restroom when you have the urge to go. Do not hold it.   Only take over-the-counter or prescription medicines as directed by your health care Tyreka Henneke. Do not take other medicines for constipation without talking to your health care Tagg Eustice first.  [place] IF:   You have bright red blood in your stool.   Your constipation lasts for more than 4 days or gets worse.   You have abdominal or rectal pain.   You have thin, pencil-like stools.   You have unexplained weight loss. MAKE SURE YOU:   Understand these instructions.  Will watch your condition.  Will get help right away if you are not doing well or get worse. Document Released: 07/14/2004 Document Revised: 10/21/2013 Document Reviewed: 07/28/2013 [facility] Patient Information 2015 [place], [place]. This information is not intended to  replace advice given to you by your health care Sanel Stemmer. Make sure you discuss any questions you have with your health care Toshiye Kever.  

## 2014-05-07 ENCOUNTER — Ambulatory Visit (INDEPENDENT_AMBULATORY_CARE_PROVIDER_SITE_OTHER): Payer: BC Managed Care – PPO | Admitting: Adult Health

## 2014-05-07 ENCOUNTER — Telehealth: Payer: Self-pay | Admitting: Adult Health

## 2014-05-07 ENCOUNTER — Encounter: Payer: Self-pay | Admitting: Adult Health

## 2014-05-07 VITALS — BP 140/66 | Ht 63.0 in | Wt 145.0 lb

## 2014-05-07 DIAGNOSIS — R300 Dysuria: Secondary | ICD-10-CM | POA: Insufficient documentation

## 2014-05-07 DIAGNOSIS — R319 Hematuria, unspecified: Secondary | ICD-10-CM

## 2014-05-07 HISTORY — DX: Dysuria: R30.0

## 2014-05-07 HISTORY — DX: Hematuria, unspecified: R31.9

## 2014-05-07 LAB — POCT URINALYSIS DIPSTICK
Glucose, UA: NEGATIVE
NITRITE UA: NEGATIVE
Protein, UA: NEGATIVE

## 2014-05-07 MED ORDER — NITROFURANTOIN MONOHYD MACRO 100 MG PO CAPS: 100.0000 mg | ORAL_CAPSULE | Freq: Two times a day (BID) | ORAL | Status: DC

## 2014-05-07 MED ORDER — PHENAZOPYRIDINE HCL 200 MG PO TABS: 200.0000 mg | ORAL_TABLET | Freq: Three times a day (TID) | ORAL | Status: DC | PRN

## 2014-05-07 NOTE — Progress Notes (Signed)
Subjective:     Patient ID: Jenna Wade, female   DOB: December 11, 1950, 63 y.o.   MRN: [id number]  HPI Jenna Wade is a 63 year old black female in complaining of burning with urination and low back pain started the weekend.Has been drinking more sodas and used new soap.No fever, has had some frequency.  Review of Systems See HPI Reviewed past medical,surgical, social and family history. Reviewed medications and allergies.   Objective:   Physical Exam BP 140/66  Ht 5\' 3"  (1.6 m)  Wt 145 lb (65.772 kg)  BMI 25.69 kg/m2  LMP 10/31/2009 Urine 2+ leuks, trace blood, No CVAT    Assessment:    Burning with urination Hematuria     Plan:    Rx macrobid 1 bid x 7 days no refills Rx pyridium 200 mg 1 tid #10 no refills UA C&S  Push fluids Review handout on UTI

## 2014-05-07 NOTE — Telephone Encounter (Signed)
Spoke with pt. Pt saw Jenna Wade 2 weeks ago. Pt states she is having burning with urination and low backpain. I advised would need to be seen. Pt voiced understanding. Call transferred to front desk. [place]

## 2014-05-07 NOTE — Patient Instructions (Signed)
Urinary Tract Infection Urinary tract infections (UTIs) can develop anywhere along your urinary tract. Your urinary tract is your body's drainage system for removing wastes and extra water. Your urinary tract includes two kidneys, two ureters, a bladder, and a urethra. Your kidneys are a pair of bean-shaped organs. Each kidney is about the size of your fist. They are located below your ribs, one on each side of your spine. CAUSES Infections are caused by microbes, which are microscopic organisms, including fungi, viruses, and bacteria. These organisms are so small that they can only be seen through a microscope. Bacteria are the microbes that most commonly cause UTIs. SYMPTOMS  Symptoms of UTIs may vary by age and gender of the patient and by the location of the infection. Symptoms in young women typically include a frequent and intense urge to urinate and a painful, burning feeling in the bladder or urethra during urination. Older women and men are more likely to be tired, shaky, and weak and have muscle aches and abdominal pain. A fever may mean the infection is in your kidneys. Other symptoms of a kidney infection include pain in your back or sides below the ribs, nausea, and vomiting. DIAGNOSIS To diagnose a UTI, your caregiver will ask you about your symptoms. Your caregiver also will ask to provide a urine sample. The urine sample will be tested for bacteria and white blood cells. White blood cells are made by your body to help fight infection. TREATMENT  Typically, UTIs can be treated with medication. Because most UTIs are caused by a bacterial infection, they usually can be treated with the use of antibiotics. The choice of antibiotic and length of treatment depend on your symptoms and the type of bacteria causing your infection. HOME CARE INSTRUCTIONS  If you were prescribed antibiotics, take them exactly as your caregiver instructs you. Finish the medication even if you feel better after you  have only taken some of the medication.  Drink enough water and fluids to keep your urine clear or pale yellow.  Avoid caffeine, tea, and carbonated beverages. They tend to irritate your bladder.  Empty your bladder often. Avoid holding urine for long periods of time.  Empty your bladder before and after sexual intercourse.  After a bowel movement, women should cleanse from front to back. Use each tissue only once. SEEK MEDICAL CARE IF:   You have back pain.  You develop a fever.  Your symptoms do not begin to resolve within 3 days. SEEK IMMEDIATE MEDICAL CARE IF:   You have severe back pain or lower abdominal pain.  You develop chills.  You have nausea or vomiting.  You have continued burning or discomfort with urination. MAKE SURE YOU:   Understand these instructions.  Will watch your condition.  Will get help right away if you are not doing well or get worse. Document Released: 07/26/2005 Document Revised: 04/16/2012 Document Reviewed: 11/24/2011 [facility] Patient Information 2015 [place], [place]. This information is not intended to replace advice given to you by your health care provider. Make sure you discuss any questions you have with your health care provider. Push fluids Follow up prn

## 2014-05-08 LAB — URINALYSIS
BILIRUBIN URINE: NEGATIVE
GLUCOSE, UA: NEGATIVE mg/dL
Hgb urine dipstick: NEGATIVE
Ketones, ur: NEGATIVE mg/dL
Nitrite: NEGATIVE
Protein, ur: NEGATIVE mg/dL
UROBILINOGEN UA: 1 mg/dL (ref 0.0–1.0)
pH: 6.5 (ref 5.0–8.0)

## 2014-05-10 LAB — URINE CULTURE: Colony Count: >=100000

## 2014-05-11 ENCOUNTER — Telehealth: Payer: Self-pay | Admitting: Adult Health

## 2014-05-11 NOTE — Telephone Encounter (Signed)
Pt feels better, aware of that macrobid should take care of it.

## 2014-05-25 ENCOUNTER — Encounter: Payer: Self-pay | Admitting: Gastroenterology

## 2014-05-25 ENCOUNTER — Encounter (INDEPENDENT_AMBULATORY_CARE_PROVIDER_SITE_OTHER): Payer: Self-pay

## 2014-05-25 ENCOUNTER — Other Ambulatory Visit: Payer: Self-pay | Admitting: Gastroenterology

## 2014-05-25 ENCOUNTER — Ambulatory Visit (INDEPENDENT_AMBULATORY_CARE_PROVIDER_SITE_OTHER): Payer: BC Managed Care – PPO | Admitting: Gastroenterology

## 2014-05-25 VITALS — BP 116/68 | HR 63 | Temp 97.0°F | Ht 63.0 in | Wt 143.6 lb

## 2014-05-25 DIAGNOSIS — Z1211 Encounter for screening for malignant neoplasm of colon: Secondary | ICD-10-CM | POA: Insufficient documentation

## 2014-05-25 DIAGNOSIS — K5900 Constipation, unspecified: Secondary | ICD-10-CM

## 2014-05-25 DIAGNOSIS — K5904 Chronic idiopathic constipation: Secondary | ICD-10-CM

## 2014-05-25 MED ORDER — PEG 3350-KCL-NA BICARB-NACL 420 G PO SOLR: 4000.0000 mL | ORAL | Status: DC

## 2014-05-25 MED ORDER — LINACLOTIDE 145 MCG PO CAPS: 145.0000 ug | ORAL_CAPSULE | Freq: Every day | ORAL | Status: DC

## 2014-05-25 NOTE — Assessment & Plan Note (Signed)
Without any concerning signs. Failed OTC agents. Start Linzess 145 mcg daily, high fiber diet. Proceed with initial screening colonoscopy.

## 2014-05-25 NOTE — Assessment & Plan Note (Signed)
Without FH of colon cancer or concerning symptoms. No prior colonoscopy.  Proceed with colonoscopy with Dr. Oneida Alar in the near future. The risks, benefits, and alternatives have been discussed in detail with the patient. They state understanding and desire to proceed.

## 2014-05-25 NOTE — Patient Instructions (Signed)
I have sent Linzess prescription to your pharmacy to take once each morning, 30 minutes before breakfast. This is for constipation. I have provided a sample voucher for you to try out a month supply.  Follow a high fiber diet and drink at least 8 large glasses of water a day.   We have scheduled you for a routine screening colonoscopy in the near future.

## 2014-05-25 NOTE — Progress Notes (Signed)
Referring Provider: Estill Dooms, NP Primary Care Physician:  [name], MD Primary Gastroenterologist:  Dr. Oneida Alar   Chief Complaint  Patient presents with  . Constipation    and schedule colonoscopy    HPI:   Jenna Wade presents today due to chronic constipation, referred by Derrek Monaco, NP. Has a BM maybe once or twice a week. Has to use OTC agents such as Miralax and suppositories. No rectal bleeding. No prior colonoscopy. No abdominal pain. Occasional mild nausea. No vomiting. No reflux. No dysphagia. No unintentional weight loss or lack of appetite.   Past Medical History  Diagnosis Date  . Hypertension   . Thyroid disease   . Hyperlipidemia   . History of mammogram   . Bronchitis   . Constipation 04/21/2014  . Burning with urination 05/07/2014  . Hematuria 05/07/2014    Past Surgical History  Procedure Laterality Date  . Exploratory laparoscopy      remote past  . Tubal ligation      Current Outpatient Prescriptions  Medication Sig Dispense Refill  . atenolol (TENORMIN) 50 MG tablet Take 50 mg by mouth daily.        Marland Kitchen ibuprofen (ADVIL,MOTRIN) 200 MG tablet Take 400 mg by mouth every 6 (six) hours as needed. Pain       . levothyroxine (SYNTHROID, LEVOTHROID) 75 MCG tablet Take 75 mcg by mouth daily.      Marland Kitchen lisinopril-hydrochlorothiazide (PRINZIDE,ZESTORETIC) 20-12.5 MG per tablet Take 1 tablet by mouth daily.        . pravastatin (PRAVACHOL) 40 MG tablet Take 40 mg by mouth daily.      . verapamil (CALAN-SR) 240 MG CR tablet Take 240 mg by mouth daily.        . Linaclotide (LINZESS) 145 MCG CAPS capsule Take 1 capsule (145 mcg total) by mouth daily. 30 minutes before breakfast  30 capsule  11   No current facility-administered medications for this visit.    Allergies as of 05/25/2014 - Review Complete 05/25/2014  Allergen Reaction Noted  . Fish allergy Swelling 11/01/2011  . Other  11/01/2011    Family History  Problem Relation Age  of Onset  . Hypertension Mother   . Hyperlipidemia Mother   . Other Mother     pacemaker  . Hypertension Father   . Heart disease Father   . Heart attack Father   . Hypertension Sister   . Hypertension Daughter   . Hypertension Son   . Hypertension Brother   . Hypertension Sister   . Hypertension Sister   . Hypertension Sister   . Hypertension Sister   . Lupus Sister   . Colon cancer Neg Hx     History   Social History  . Marital Status: Married    Spouse Name: N/A    Number of Children: N/A  . Years of Education: N/A   Occupational History  . Not on file.   Social History Main Topics  . Smoking status: Never Smoker   . Smokeless tobacco: Never Used     Comment: Never smoked  . Alcohol Use: Yes     Comment: special occ  . Drug Use: No  . Sexual Activity: Yes    Birth Control/ Protection: Post-menopausal   Other Topics Concern  . Not on file   Social History Narrative  . No narrative on file    Review of Systems: Gen: see HPI CV: occasional palpitations Resp: +DOE GI: see HPI  GU : Denies urinary burning, urinary frequency, urinary incontinence.  MS: knee discomfort Derm: Denies rash, itching, dry skin Psych: Denies depression, anxiety, confusion or memory loss  Heme: Denies bruising, bleeding, and enlarged lymph nodes.  Physical Exam: BP 116/68  Pulse 63  Temp(Src) 97 F (36.1 C) (Oral)  Ht 5\' 3"  (1.6 m)  Wt 143 lb 9.6 oz (65.137 kg)  BMI 25.44 kg/m2  LMP 10/31/2009 General:   Alert and oriented. Well-developed, well-nourished, pleasant and cooperative. Head:  Normocephalic and atraumatic. Eyes:  Conjunctiva pink, sclera clear, no icterus.   Conjunctiva pink. Ears:  Normal auditory acuity. Nose:  No deformity, discharge,  or lesions. Mouth:  No deformity or lesions, mucosa pink and moist.  Neck:  Supple, without mass or thyromegaly. Lungs:  Clear to auscultation bilaterally, without wheezing, rales, or rhonchi.  Heart:  S1, S2 present without  murmurs noted.  Abdomen:  +BS, soft, non-tender and non-distended. Without mass or HSM. No rebound or guarding. No hernias noted. Rectal:  Deferred  Msk:  Symmetrical without gross deformities. Normal posture. Extremities:  Without clubbing or edema. Neurologic:  Alert and  oriented x4;  grossly normal neurologically. Skin:  Intact, warm and dry without significant lesions or rashes Cervical Nodes:  No significant cervical adenopathy. Psych:  Alert and cooperative. Normal mood and affect.

## 2014-05-28 ENCOUNTER — Encounter (HOSPITAL_COMMUNITY): Payer: Self-pay | Admitting: *Deleted

## 2014-05-28 ENCOUNTER — Ambulatory Visit (HOSPITAL_COMMUNITY)
Admission: RE | Admit: 2014-05-28 | Discharge: 2014-05-28 | Disposition: A | Discharge status: Home / Self Care | Payer: BC Managed Care – PPO | Source: Ambulatory Visit | Attending: Gastroenterology | Admitting: Gastroenterology

## 2014-05-28 ENCOUNTER — Encounter (HOSPITAL_COMMUNITY)
Admission: RE | Disposition: A | Discharge status: Home / Self Care | Payer: Self-pay | Source: Ambulatory Visit | Attending: Gastroenterology

## 2014-05-28 DIAGNOSIS — Z79899 Other long term (current) drug therapy: Secondary | ICD-10-CM | POA: Insufficient documentation

## 2014-05-28 DIAGNOSIS — K648 Other hemorrhoids: Secondary | ICD-10-CM | POA: Insufficient documentation

## 2014-05-28 DIAGNOSIS — E039 Hypothyroidism, unspecified: Secondary | ICD-10-CM | POA: Insufficient documentation

## 2014-05-28 DIAGNOSIS — D126 Benign neoplasm of colon, unspecified: Secondary | ICD-10-CM

## 2014-05-28 DIAGNOSIS — E785 Hyperlipidemia, unspecified: Secondary | ICD-10-CM | POA: Insufficient documentation

## 2014-05-28 DIAGNOSIS — Q438 Other specified congenital malformations of intestine: Secondary | ICD-10-CM | POA: Insufficient documentation

## 2014-05-28 DIAGNOSIS — I10 Essential (primary) hypertension: Secondary | ICD-10-CM | POA: Insufficient documentation

## 2014-05-28 DIAGNOSIS — Z1211 Encounter for screening for malignant neoplasm of colon: Secondary | ICD-10-CM

## 2014-05-28 HISTORY — PX: COLONOSCOPY: SHX5424

## 2014-05-28 HISTORY — DX: Hypothyroidism, unspecified: E03.9

## 2014-05-28 SURGERY — COLONOSCOPY
Anesthesia: Moderate Sedation

## 2014-05-28 MED ORDER — MIDAZOLAM HCL 5 MG/5ML IJ SOLN
INTRAMUSCULAR | Status: AC
  Filled 2014-05-28: qty 10

## 2014-05-28 MED ORDER — MIDAZOLAM HCL 5 MG/5ML IJ SOLN
INTRAMUSCULAR | Status: DC | PRN
  Administered 2014-05-28 (×2): 2 mg via INTRAVENOUS

## 2014-05-28 MED ORDER — STERILE WATER FOR IRRIGATION IR SOLN
Status: DC | PRN
  Administered 2014-05-28: 08:00:00

## 2014-05-28 MED ORDER — MEPERIDINE HCL 100 MG/ML IJ SOLN
INTRAMUSCULAR | Status: DC | PRN
  Administered 2014-05-28 (×2): 25 mg via INTRAVENOUS

## 2014-05-28 MED ORDER — MEPERIDINE HCL 100 MG/ML IJ SOLN
INTRAMUSCULAR | Status: AC
  Filled 2014-05-28: qty 2

## 2014-05-28 MED ORDER — SODIUM CHLORIDE 0.9 % IV SOLN
INTRAVENOUS | Status: DC
  Administered 2014-05-28: 08:00:00 via INTRAVENOUS

## 2014-05-28 NOTE — H&P (Signed)
Primary Care Physician:  [name], MD Primary Gastroenterologist:  Dr. Oneida Alar  Pre-Procedure History & Physical: HPI:  Jenna Wade is a 63 y.o. female here for [place].  Past Medical History  Diagnosis Date  . Hypertension   . Thyroid disease   . Hyperlipidemia   . History of mammogram   . Bronchitis   . Constipation 04/21/2014  . Burning with urination 05/07/2014  . Hematuria 05/07/2014  . Hypothyroidism     Past Surgical History  Procedure Laterality Date  . Exploratory laparoscopy      remote past  . Tubal ligation      Prior to Admission medications   Medication Sig Start Date End Date Taking? Authorizing Provider  atenolol (TENORMIN) 50 MG tablet Take 50 mg by mouth daily.     Yes Historical Provider, MD  ibuprofen (ADVIL,MOTRIN) 200 MG tablet Take 400 mg by mouth every 6 (six) hours as needed. Pain    Yes Historical Provider, MD  levothyroxine (SYNTHROID, LEVOTHROID) 75 MCG tablet Take 75 mcg by mouth daily.   Yes Historical Provider, MD  Linaclotide Rolan Lipa) 145 MCG CAPS capsule Take 1 capsule (145 mcg total) by mouth daily. 30 minutes before breakfast 05/25/14  Yes Orvil Feil, NP  lisinopril-hydrochlorothiazide (PRINZIDE,ZESTORETIC) 20-12.5 MG per tablet Take 1 tablet by mouth daily.     Yes Historical Provider, MD  polyethylene glycol-electrolytes (TRILYTE) 420 G solution Take 4,000 mLs by mouth as directed. 05/25/14  Yes Danie Binder, MD  pravastatin (PRAVACHOL) 40 MG tablet Take 40 mg by mouth daily.   Yes Historical Provider, MD  verapamil (CALAN-SR) 240 MG CR tablet Take 240 mg by mouth daily.     Yes Historical Provider, MD    Allergies as of 05/25/2014 - Review Complete 05/25/2014  Allergen Reaction Noted  . Fish allergy Swelling 11/01/2011  . Other  11/01/2011    Family History  Problem Relation Age of Onset  . Hypertension Mother   . Hyperlipidemia Mother   . Other Mother     pacemaker  . Hypertension Father   . Heart  disease Father   . Heart attack Father   . Hypertension Sister   . Hypertension Daughter   . Hypertension Son   . Hypertension Brother   . Hypertension Sister   . Hypertension Sister   . Hypertension Sister   . Hypertension Sister   . Lupus Sister   . Colon cancer Neg Hx     History   Social History  . Marital Status: Married    Spouse Name: N/A    Number of Children: N/A  . Years of Education: N/A   Occupational History  . Not on file.   Social History Main Topics  . Smoking status: Never Smoker   . Smokeless tobacco: Never Used     Comment: Never smoked  . Alcohol Use: Yes     Comment: special occ  . Drug Use: No  . Sexual Activity: Yes    Birth Control/ Protection: Post-menopausal   Other Topics Concern  . Not on file   Social History Narrative  . No narrative on file    Review of Systems: See HPI, otherwise negative ROS   Physical Exam: BP 149/69  Pulse 58  Temp(Src) 97.6 F (36.4 C) (Oral)  Resp 18  SpO2 98%  LMP 10/31/2009 General:   Alert,  pleasant and cooperative in NAD Head:  Normocephalic and atraumatic. Neck:  Supple; Lungs:  Clear throughout to auscultation.  Heart:  Regular rate and rhythm. Abdomen:  Soft, nontender and nondistended. Normal bowel sounds, without guarding, and without rebound.   Neurologic:  Alert and  oriented x4;  grossly normal neurologically.  Impression/Plan:     SCREENING  Plan:  1. TCS TODAY

## 2014-05-28 NOTE — Discharge Instructions (Signed)
You had ONE polyp removed FROM YOUR COLON. I TATTOOED THE BASE OF THE POLYP. YOU HAVE A FLOPPY COLON. You have internal hemorrhoids.    DRINK WATER TO KEEP YOUR URINE LIGHT YELLOW.  CONTINUE LINZESS.  FOLLOW A HIGH FIBER DIET. AVOID ITEMS THAT CAUSE BLOATING & GAS. SEE INFO BELOW.  YOUR BIOPSY RESULTS SHOULD BE BACK IN 7 DAYS.  Next colonoscopy in 1-3 years. YOUR SISTERS, BROTHERS, CHILDREN, AND PARENTS NEED TO HAVE A COLONOSCOPY STARTING AT THE AGE OF 40.   Colonoscopy Care After Read the instructions outlined below and refer to this sheet in the next week. These discharge instructions provide you with general information on caring for yourself after you leave the hospital. While your treatment has been planned according to the most current medical practices available, unavoidable complications occasionally occur. If you have any problems or questions after discharge, call DR. Gagandeep Pettet, [phone].  ACTIVITY  You may resume your regular activity, but move at a slower pace for the next 24 hours.   Take frequent rest periods for the next 24 hours.   Walking will help get rid of the air and reduce the bloated feeling in your belly (abdomen).   No driving for 24 hours (because of the medicine (anesthesia) used during the test).   You may shower.   Do not sign any important legal documents or operate any machinery for 24 hours (because of the anesthesia used during the test).    NUTRITION  Drink plenty of fluids.   You may resume your normal diet as instructed by your doctor.   Begin with a light meal and progress to your normal diet. Heavy or fried foods are harder to digest and may make you feel sick to your stomach (nauseated).   Avoid alcoholic beverages for 24 hours or as instructed.    MEDICATIONS  You may resume your normal medications.   WHAT YOU CAN EXPECT TODAY  Some feelings of bloating in the abdomen.   Passage of more gas than usual.   Spotting of  blood in your stool or on the toilet paper  .  IF YOU HAD POLYPS REMOVED DURING THE COLONOSCOPY:  Eat a soft diet IF YOU HAVE NAUSEA, BLOATING, ABDOMINAL PAIN, OR VOMITING.    FINDING OUT THE RESULTS OF YOUR TEST Not all test results are available during your visit. DR. Oneida Alar WILL CALL YOU WITHIN 7 DAYS OF YOUR PROCEDUE WITH YOUR RESULTS. Do not assume everything is normal if you have not heard from DR. Emmalie Haigh IN ONE WEEK, CALL HER OFFICE AT [phone].  SEEK IMMEDIATE MEDICAL ATTENTION AND CALL THE OFFICE: [phone] IF:  You have more than a spotting of blood in your stool.   Your belly is swollen (abdominal distention).   You are nauseated or vomiting.   You have a temperature over 101F.   You have abdominal pain or discomfort that is severe or gets worse throughout the day.   High-Fiber Diet A high-fiber diet changes your normal diet to include more whole grains, legumes, fruits, and vegetables. Changes in the diet involve replacing refined carbohydrates with unrefined foods. The calorie level of the diet is essentially unchanged. The Dietary Reference Intake (recommended amount) for adult males is 38 grams per day. For adult females, it is 25 grams per day. Pregnant and lactating women should consume 28 grams of fiber per day. Fiber is the intact part of a plant that is not broken down during digestion. Functional fiber is fiber that has  been isolated from the plant to provide a beneficial effect in the body. PURPOSE  Increase stool bulk.   Ease and regulate bowel movements.   Lower cholesterol.  INDICATIONS THAT YOU NEED MORE FIBER  Constipation and hemorrhoids.   Uncomplicated diverticulosis (intestine condition) and irritable bowel syndrome.   Weight management.   As a protective measure against hardening of the arteries (atherosclerosis), diabetes, and cancer.   GUIDELINES FOR INCREASING FIBER IN THE DIET  Start adding fiber to the diet slowly. A gradual  increase of about 5 more grams (2 slices of whole-wheat bread, 2 servings of most fruits or vegetables, or 1 bowl of high-fiber cereal) per day is best. Too rapid an increase in fiber may result in constipation, flatulence, and bloating.   Drink enough water and fluids to keep your urine clear or pale yellow. Water, juice, or caffeine-free drinks are recommended. Not drinking enough fluid may cause constipation.   Eat a variety of high-fiber foods rather than one type of fiber.   Try to increase your intake of fiber through using high-fiber foods rather than fiber pills or supplements that contain small amounts of fiber.   The goal is to change the types of food eaten. Do not supplement your present diet with high-fiber foods, but replace foods in your present diet.  INCLUDE A VARIETY OF FIBER SOURCES  Replace refined and processed grains with whole grains, canned fruits with fresh fruits, and incorporate other fiber sources. White rice, white breads, and most bakery goods contain little or no fiber.   Brown whole-grain rice, buckwheat oats, and many fruits and vegetables are all good sources of fiber. These include: broccoli, Brussels sprouts, cabbage, cauliflower, beets, sweet potatoes, white potatoes (skin on), carrots, tomatoes, eggplant, squash, berries, fresh fruits, and dried fruits.   Cereals appear to be the richest source of fiber. Cereal fiber is found in whole grains and bran. Bran is the fiber-rich outer coat of cereal grain, which is largely removed in refining. In whole-grain cereals, the bran remains. In breakfast cereals, the largest amount of fiber is found in those with "bran" in their names. The fiber content is sometimes indicated on the label.   You may need to include additional fruits and vegetables each day.   In baking, for 1 cup white flour, you may use the following substitutions:   1 cup whole-wheat flour minus 2 tablespoons.   1/2 cup white flour plus 1/2 cup  whole-wheat flour.   Polyps, Colon  A polyp is extra tissue that grows inside your body. Colon polyps grow in the large intestine. The large intestine, also called the colon, is part of your digestive system. It is a long, hollow tube at the end of your digestive tract where your body makes and stores stool. Most polyps are not dangerous. They are benign. This means they are not cancerous. But over time, some types of polyps can turn into cancer. Polyps that are smaller than a pea are usually not harmful. But larger polyps could someday become or may already be cancerous. To be safe, doctors remove all polyps and test them.   WHO GETS POLYPS? Anyone can get polyps, but certain people are more likely than others. You may have a greater chance of getting polyps if:  You are over 50.   You have had polyps before.   Someone in your family has had polyps.   Someone in your family has had cancer of the large intestine.   Find out  if someone in your family has had polyps. You may also be more likely to get polyps if you:   Eat a lot of fatty foods   Smoke   Drink alcohol   Do not exercise  Eat too much   TREATMENT  The caregiver will remove the polyp during sigmoidoscopy or colonoscopy.    PREVENTION There is not one sure way to prevent polyps. You might be able to lower your risk of getting them if you:  Eat more fruits and vegetables and less fatty food.   Do not smoke.   Avoid alcohol.   Exercise every day.   Lose weight if you are overweight.   Eating more calcium and folate can also lower your risk of getting polyps. Some foods that are rich in calcium are milk, cheese, and broccoli. Some foods that are rich in folate are chickpeas, kidney beans, and spinach.   Hemorrhoids Hemorrhoids are dilated (enlarged) veins around the rectum. Sometimes clots will form in the veins. This makes them swollen and painful. These are called thrombosed hemorrhoids. Causes of hemorrhoids  include:  Constipation.   Straining to have a bowel movement.   HEAVY LIFTING HOME CARE INSTRUCTIONS  Eat a well balanced diet and drink 6 to 8 glasses of water every day to avoid constipation. You may also use a bulk laxative.   Avoid straining to have bowel movements.   Keep anal area dry and clean.   Do not use a donut shaped pillow or sit on the toilet for long periods. This increases blood pooling and pain.   Move your bowels when your body has the urge; this will require less straining and will decrease pain and pressure.

## 2014-05-28 NOTE — Op Note (Signed)
[facility] [address] [place], [address]   COLONOSCOPY PROCEDURE REPORT  PATIENT: Jenna Wade, Jenna Wade  MR#: [id number] BIRTHDATE: May 17, 1951 , 63  yrs. old GENDER: Female ENDOSCOPIST: Barney Drain, MD REFERRED SM:OLMBEML Cindie Laroche, M.D. PROCEDURE DATE:  05/28/2014 PROCEDURE:   Colonoscopy with snare polypectomy INDICATIONS:Average risk patient for colon cancer. MEDICATIONS: Demerol 50 mg IV and Versed 5 mg IV  DESCRIPTION OF PROCEDURE:    Physical exam was performed.  Informed consent was obtained from the patient after explaining the benefits, risks, and alternatives to procedure.  The patient was connected to monitor and placed in left lateral position. Continuous oxygen was provided by nasal cannula and IV medicine administered through an indwelling cannula.  After administration of sedation and rectal exam, the patients rectum was intubated and the EC-[id number] ([id number])  colonoscope was advanced under direct visualization to the ileum.  The scope was removed slowly by carefully examining the color, texture, anatomy, and integrity mucosa on the way out.  The patient was recovered in endoscopy and discharged home in satisfactory condition.    COLON FINDINGS: The mucosa appeared normal in the terminal ileum.  , A sessile polyp measuring 1.2 cm in size was found at the hepatic flexure.  A polypectomy was performed using snare cautery. Injection (tattooing) was performed(3 cc). The LEFT colon IS redundant.  Manual abdominal counter-pressure was used to reach the cecum.  The patient was moved on to their back to reach the cecum, and Moderate sized internal hemorrhoids were found.  PREP QUALITY: excellent.  CECAL W/D TIME: 33 minutes COMPLICATIONS: None  ENDOSCOPIC IMPRESSION: 1.   Normal mucosa in the terminal ileum 2.   ONE LARGE HF POLYP REMOVED 3.   The LEFT colon IS redundant 4.   Moderate sized internal hemorrhoids  RECOMMENDATIONS: FOLLOW A HIGH FIBER  DIET.  AVOID ITEMS THAT CAUSE BLOATING & GAS. BIOPSY RESULTS SHOULD BE BACK IN 7 DAYS.  Next colonoscopy in 1-3 years WITH OVERTUBE/ENDDCUFF.  ALL SISTERS, BROTHERS, CHILDREN, AND PARENTS NEED TO HAVE A COLONOSCOPY STARTING AT THE AGE OF 40.       _______________________________ Lorrin MaisBarney Drain, MD 05/28/2014 10:01 AM

## 2014-05-29 ENCOUNTER — Encounter (HOSPITAL_COMMUNITY): Payer: Self-pay | Admitting: Gastroenterology

## 2014-06-15 ENCOUNTER — Telehealth: Payer: Self-pay | Admitting: Gastroenterology

## 2014-06-20 NOTE — Telephone Encounter (Signed)
Please call pt. She had a simple adenoma removed from her colon.    DRINK WATER TO KEEP YOUR URINE LIGHT YELLOW. CONTINUE LINZESS. FOLLOW A HIGH FIBER DIET. AVOID ITEMS THAT CAUSE BLOATING & GAS.    OPV IN OCT 2015 E15 CONSTIPATION Next colonoscopy in 3 years. YOUR SISTERS, BROTHERS, CHILDREN, AND PARENTS NEED TO HAVE A COLONOSCOPY STARTING AT THE AGE OF 40.

## 2014-06-22 NOTE — Telephone Encounter (Signed)
Reminder in EPIC 

## 2014-06-22 NOTE — Telephone Encounter (Signed)
Called and informed pt.  

## 2014-06-23 ENCOUNTER — Encounter: Payer: Self-pay | Admitting: Gastroenterology

## 2014-06-23 NOTE — Telephone Encounter (Signed)
Pt is aware of OV on 10/14 at 0915 with SF and appt card mailed

## 2014-07-09 ENCOUNTER — Encounter: Payer: Self-pay | Admitting: Gastroenterology

## 2014-08-12 ENCOUNTER — Encounter: Payer: Self-pay | Admitting: Gastroenterology

## 2014-08-12 ENCOUNTER — Ambulatory Visit (INDEPENDENT_AMBULATORY_CARE_PROVIDER_SITE_OTHER): Payer: BC Managed Care – PPO | Admitting: Gastroenterology

## 2014-08-12 VITALS — BP 126/73 | HR 85 | Temp 98.3°F | Ht 63.0 in | Wt 142.8 lb

## 2014-08-12 DIAGNOSIS — K5901 Slow transit constipation: Secondary | ICD-10-CM

## 2014-08-12 NOTE — Progress Notes (Signed)
cc'ed to pcp °

## 2014-08-12 NOTE — Patient Instructions (Addendum)
DRINK WATER TO KEEP YOUR URINE LIGHT YELLOW.  FOLLOW A HIGH FIBER DIET. SEE INFO BELOW.  TAKE LINZESS WITH BREAKFAST.  ADD IBGARD 2 PO 30 MINS PRIOR TO BREAKFAST.  CALL IN 3 WEEKS IF YOUR SYMPTOMS HAVE NOT IMPROVED.  FOLLOW UP IN 6 MOS. MERRY CHRISTMAS AND HAPPY NEW YEAR!  High-Fiber Diet A high-fiber diet changes your normal diet to include more whole grains, legumes, fruits, and vegetables. Changes in the diet involve replacing refined carbohydrates with unrefined foods. The calorie level of the diet is essentially unchanged. The Dietary Reference Intake (recommended amount) for adult males is 38 grams per day. For adult females, it is 25 grams per day. Pregnant and lactating women should consume 28 grams of fiber per day. Fiber is the intact part of a plant that is not broken down during digestion. Functional fiber is fiber that has been isolated from the plant to provide a beneficial effect in the body. PURPOSE  Increase stool bulk.   Ease and regulate bowel movements.   Lower cholesterol.  INDICATIONS THAT YOU NEED MORE FIBER  Constipation and hemorrhoids.   Uncomplicated diverticulosis (intestine condition) and irritable bowel syndrome.   Weight management.   As a protective measure against hardening of the arteries (atherosclerosis), diabetes, and cancer.   GUIDELINES FOR INCREASING FIBER IN THE DIET  Start adding fiber to the diet slowly. A gradual increase of about 5 more grams (2 slices of whole-wheat bread, 2 servings of most fruits or vegetables, or 1 bowl of high-fiber cereal) per day is best. Too rapid an increase in fiber may result in constipation, flatulence, and bloating.   Drink enough water and fluids to keep your urine clear or pale yellow. Water, juice, or caffeine-free drinks are recommended. Not drinking enough fluid may cause constipation.   Eat a variety of high-fiber foods rather than one type of fiber.   Try to increase your intake of fiber through  using high-fiber foods rather than fiber pills or supplements that contain small amounts of fiber.   The goal is to change the types of food eaten. Do not supplement your present diet with high-fiber foods, but replace foods in your present diet.  INCLUDE A VARIETY OF FIBER SOURCES  Replace refined and processed grains with whole grains, canned fruits with fresh fruits, and incorporate other fiber sources. White rice, white breads, and most bakery goods contain little or no fiber.   Brown whole-grain rice, buckwheat oats, and many fruits and vegetables are all good sources of fiber. These include: broccoli, Brussels sprouts, cabbage, cauliflower, beets, sweet potatoes, white potatoes (skin on), carrots, tomatoes, eggplant, squash, berries, fresh fruits, and dried fruits.   Cereals appear to be the richest source of fiber. Cereal fiber is found in whole grains and bran. Bran is the fiber-rich outer coat of cereal grain, which is largely removed in refining. In whole-grain cereals, the bran remains. In breakfast cereals, the largest amount of fiber is found in those with "bran" in their names. The fiber content is sometimes indicated on the label.   You may need to include additional fruits and vegetables each day.   In baking, for 1 cup white flour, you may use the following substitutions:   1 cup whole-wheat flour minus 2 tablespoons.   1/2 cup white flour plus 1/2 cup whole-wheat flour.

## 2014-08-12 NOTE — Progress Notes (Signed)
OV NIC'D

## 2014-08-12 NOTE — Assessment & Plan Note (Addendum)
SX IMPROVED BUT NOT IDEALLY CONTROLLED.   DRINK WATER TO KEEP YOUR URINE LIGHT YELLOW. FOLLOW A HIGH FIBER DIET.  HO GIVEN. TAKE LINZESS WITH BREAKFAST. ADD IBGARD 2 PO 30 MINS PRIOR TO BREAKFAST CALL IN 3 WEEKS IF YOUR SYMPTOMS HAVE NOT IMPROVED. FOLLOW UP IN 6 MOS. MERRY CHRISTMAS AND HAPPY NEW YEAR!

## 2014-08-12 NOTE — Progress Notes (Signed)
   Subjective:    Patient ID: Jenna Wade, female    DOB: [birth year], 63 y.o.   MRN: [id number]  Jenna Curet, MD  HPI Initially good response with [facility]. HAS A LOT OF GAS. NOW USING FLEETS SUPP IF SHE ONCE TO TWO TIMES A WEEK. STOOL IS SOFT. MAY BE HARD TO PASS. NO MORE RECTAL PRESSURE. DRINKING WATER. EAT LOTS OF GREENS AND FRUITS.   PT DENIES FEVER, CHILLS, HEMATOCHEZIA, nausea, vomiting, melena, diarrhea, CHEST PAIN, SHORTNESS OF BREATH, abdominal pain, problems swallowing, problems with sedation, OR heartburn or indigestion.  Past Medical History  Diagnosis Date  . Hypertension   . Thyroid disease   . Hyperlipidemia   . History of mammogram   . Bronchitis   . Constipation 04/21/2014  . Burning with urination 05/07/2014  . Hematuria 05/07/2014  . Hypothyroidism     Past Surgical History  Procedure Laterality Date  . Exploratory laparoscopy      remote past  . Tubal ligation    . Colonoscopy N/A 05/28/2014    Procedure: COLONOSCOPY;  Surgeon: Danie Binder, MD;  Location: [facility];  Service: Endoscopy;  Laterality: N/A;  8:00   Allergies  Allergen Reactions  . Fish Allergy Swelling  . Other     Unable to recall - "it's on record at [facility] office"    Current Outpatient Prescriptions  Medication Sig Dispense Refill  . atenolol (TENORMIN) 50 MG tablet Take 50 mg by mouth daily.        Marland Kitchen ibuprofen (ADVIL,MOTRIN) 200 MG tablet Take 400 mg by mouth every 6 (six) hours as needed. Pain       . levothyroxine (SYNTHROID, LEVOTHROID) 75 MCG tablet Take 75 mcg by mouth daily.      . Linaclotide (LINZESS) 145 MCG CAPS capsule Take 1 capsule (145 mcg total) by mouth daily. 30 minutes before breakfast    . lisinopril-hydrochlorothiazide (PRINZIDE,ZESTORETIC) 20-12.5 MG per tablet Take 1 tablet by mouth daily.        . pravastatin (PRAVACHOL) 40 MG tablet Take 40 mg by mouth daily.      . verapamil (CALAN-SR) 240 MG CR tablet Take 240 mg by mouth daily.            Review of Systems     Objective:   Physical Exam  Vitals reviewed. Constitutional: She is oriented to person, place, and time. She appears well-developed and well-nourished. No distress.  HENT:  Head: Normocephalic and atraumatic.  Mouth/Throat: Oropharynx is clear and moist. No oropharyngeal exudate.  Eyes: Pupils are equal, round, and reactive to light. No scleral icterus.  Neck: Normal range of motion. Neck supple.  Cardiovascular: Normal rate, regular rhythm and normal heart sounds.   Pulmonary/Chest: Effort normal and breath sounds normal. No respiratory distress.  Abdominal: Soft. Bowel sounds are normal. She exhibits no distension. There is no tenderness.  Musculoskeletal: She exhibits no edema.  Lymphadenopathy:    She has no cervical adenopathy.  Neurological: She is alert and oriented to person, place, and time.  Psychiatric: She has a normal mood and affect.          Assessment & Plan:

## 2014-08-31 ENCOUNTER — Encounter: Payer: Self-pay | Admitting: Gastroenterology

## 2014-10-08 ENCOUNTER — Other Ambulatory Visit (HOSPITAL_COMMUNITY): Payer: Self-pay | Admitting: Family Medicine

## 2014-10-08 DIAGNOSIS — Z1231 Encounter for screening mammogram for malignant neoplasm of breast: Secondary | ICD-10-CM

## 2014-10-14 ENCOUNTER — Ambulatory Visit (HOSPITAL_COMMUNITY)
Admission: RE | Admit: 2014-10-14 | Discharge: 2014-10-14 | Disposition: A | Discharge status: Home / Self Care | Payer: BC Managed Care – PPO | Source: Ambulatory Visit | Attending: Family Medicine | Admitting: Family Medicine

## 2014-10-14 ENCOUNTER — Ambulatory Visit (HOSPITAL_COMMUNITY): Payer: BC Managed Care – PPO

## 2014-10-14 DIAGNOSIS — Z1231 Encounter for screening mammogram for malignant neoplasm of breast: Secondary | ICD-10-CM | POA: Insufficient documentation

## 2014-12-11 ENCOUNTER — Emergency Department (HOSPITAL_COMMUNITY)
Admission: EM | Admit: 2014-12-11 | Discharge: 2014-12-11 | Disposition: A | Discharge status: Home / Self Care | Payer: 59 | Attending: Emergency Medicine | Admitting: Emergency Medicine

## 2014-12-11 ENCOUNTER — Encounter (HOSPITAL_COMMUNITY): Payer: Self-pay | Admitting: Cardiology

## 2014-12-11 DIAGNOSIS — I10 Essential (primary) hypertension: Secondary | ICD-10-CM | POA: Insufficient documentation

## 2014-12-11 DIAGNOSIS — B350 Tinea barbae and tinea capitis: Secondary | ICD-10-CM | POA: Diagnosis not present

## 2014-12-11 DIAGNOSIS — Z8719 Personal history of other diseases of the digestive system: Secondary | ICD-10-CM | POA: Insufficient documentation

## 2014-12-11 DIAGNOSIS — E785 Hyperlipidemia, unspecified: Secondary | ICD-10-CM | POA: Insufficient documentation

## 2014-12-11 DIAGNOSIS — E079 Disorder of thyroid, unspecified: Secondary | ICD-10-CM | POA: Diagnosis not present

## 2014-12-11 DIAGNOSIS — Z79899 Other long term (current) drug therapy: Secondary | ICD-10-CM | POA: Insufficient documentation

## 2014-12-11 DIAGNOSIS — E039 Hypothyroidism, unspecified: Secondary | ICD-10-CM | POA: Diagnosis not present

## 2014-12-11 DIAGNOSIS — J358 Other chronic diseases of tonsils and adenoids: Secondary | ICD-10-CM | POA: Diagnosis present

## 2014-12-11 MED ORDER — GRISEOFULVIN MICROSIZE 500 MG PO TABS
500.0000 mg | ORAL_TABLET | Freq: Every day | ORAL | Status: DC
Start: 2014-12-11 — End: 2016-08-10

## 2014-12-11 NOTE — ED Notes (Signed)
C/o knots on left side of head times one month.

## 2014-12-11 NOTE — Discharge Instructions (Signed)
Scalp Ringworm (Tinea Capitis)  Scalp ringworm is an infection of the skin on the head. It is mainly seen in children. HOME CARE  Only take medicine as told by your doctor. Medicine must be taken for 6 to 8 weeks to kill the fungus. Steroid medicines are used for very bad cases to reduce redness, soreness, and puffiness (inflammation).  Watch to see if ringworm develops in your family or pets. Treat any family members or pets that have the fungus. The fungus can spread from person to person (contagious).  Use medicated shampoos to help stop the fungus from spreading.  Do not share towels, brushes, combs, hair clips, or hats.  Children may go to school once they start taking medicine.  Follow up with your doctor as told to be sure the infection is gone. It can take 1 month or more to treat scalp ringworm. If you do not treat it as told, the ringworm can come back. GET HELP RIGHT AWAY IF:   The area becomes red, warm, tender, and puffy (swollen).  Yellowish white fluid (pus) comes from the rash.  You or your child has a temperature by mouth above 102 F (38.9 C), not controlled by medicine.  The rash gets worse or spreads.  The rash returns after treatment is done.  The rash is not better after 2 weeks of treatment. MAKE SURE YOU:  Understand these instructions.  Will watch your condition.  Will get help right away if you are not doing well or get worse. Document Released: 10/04/2009 Document Revised: 03/02/2014 Document Reviewed: 01/21/2010 [facility] Patient Information 2015 [place], [place]. This information is not intended to replace advice given to you by your health care provider. Make sure you discuss any questions you have with your health care provider.

## 2014-12-12 NOTE — ED Provider Notes (Signed)
CSN: [id number]     Arrival date & time 12/11/14  1023 History   First MD Initiated Contact with Patient 12/11/14 1047     Chief Complaint  Patient presents with  . Cyst     (Consider location/radiation/quality/duration/timing/severity/associated sxs/prior Treatment) HPI  DESHUNDRA WALLER is a 64 y.o. female who presents to the Emergency Department complaining of rash to her scalp with several small "knots."  She states the symptoms have been present for one month.  She also c/o itching to her scalp and patchy hair loss.  She is concerned that her symptoms may be related to using chemicals on her hair.  Nothing has made the symptoms better or worse.   She denies fever, rash to her body, swelling or drainage.     Past Medical History  Diagnosis Date  . Hypertension   . Thyroid disease   . Hyperlipidemia   . History of mammogram   . Bronchitis   . Constipation 04/21/2014  . Burning with urination 05/07/2014  . Hematuria 05/07/2014  . Hypothyroidism    Past Surgical History  Procedure Laterality Date  . Exploratory laparoscopy      remote past  . Tubal ligation    . Colonoscopy N/A 05/28/2014    Procedure: COLONOSCOPY;  Surgeon: Danie Binder, MD;  Location: [facility];  Service: Endoscopy;  Laterality: N/A;  8:00   Family History  Problem Relation Age of Onset  . Hypertension Mother   . Hyperlipidemia Mother   . Other Mother     pacemaker  . Hypertension Father   . Heart disease Father   . Heart attack Father   . Hypertension Sister   . Hypertension Daughter   . Hypertension Son   . Hypertension Brother   . Hypertension Sister   . Hypertension Sister   . Hypertension Sister   . Hypertension Sister   . Lupus Sister   . Colon cancer Neg Hx    History  Substance Use Topics  . Smoking status: Never Smoker   . Smokeless tobacco: Never Used     Comment: Never smoked  . Alcohol Use: Yes     Comment: special occ   OB History    Gravida Para Term Preterm AB TAB SAB  Ectopic Multiple Living   2 2        2      Review of Systems  Constitutional: Negative for fever and chills.  HENT: Negative for trouble swallowing.   Gastrointestinal: Negative for nausea and vomiting.  Musculoskeletal: Negative for joint swelling and arthralgias.  Skin:       Rash and nodules to the scalp  Neurological: Negative for dizziness, weakness, numbness and headaches.  Hematological: Positive for adenopathy.  All other systems reviewed and are negative.     Allergies  Fish allergy and Other  Home Medications   Prior to Admission medications   Medication Sig Start Date End Date Taking? Authorizing Provider  atenolol (TENORMIN) 50 MG tablet Take 50 mg by mouth daily.     Yes Historical Provider, MD  ibuprofen (ADVIL,MOTRIN) 200 MG tablet Take 400 mg by mouth every 6 (six) hours as needed. Pain    Yes Historical Provider, MD  levothyroxine (SYNTHROID, LEVOTHROID) 75 MCG tablet Take 75 mcg by mouth daily.   Yes Historical Provider, MD  Linaclotide Rolan Lipa) 145 MCG CAPS capsule Take 1 capsule (145 mcg total) by mouth daily. 30 minutes before breakfast 05/25/14  Yes Orvil Feil, NP  lisinopril-hydrochlorothiazide Reita May)  20-12.5 MG per tablet Take 1 tablet by mouth daily.     Yes Historical Provider, MD  pravastatin (PRAVACHOL) 40 MG tablet Take 40 mg by mouth daily.   Yes Historical Provider, MD  verapamil (CALAN-SR) 240 MG CR tablet Take 240 mg by mouth daily.     Yes Historical Provider, MD  griseofulvin (GRIFULVIN V) 500 MG tablet Take 1 tablet (500 mg total) by mouth daily. 12/11/14   Jamol Ginyard L. Prashant Glosser, PA-C   BP 135/64 mmHg  Pulse 61  Temp(Src) 98.6 F (37 C) (Oral)  Resp 18  Ht 5' 2.5" (1.588 m)  Wt 140 lb (63.504 kg)  BMI 25.18 kg/m2  SpO2 99%  LMP 10/31/2009 Physical Exam  Constitutional: She is oriented to person, place, and time. She appears well-developed and well-nourished. No distress.  HENT:  Head: Atraumatic.  Mouth/Throat: Oropharynx  is clear and moist.  Macular circular lesions to the scalp several small nodules to the scalp.  No pustules or kerions.  Patchy alopecia.  Neck: Normal range of motion. Neck supple.  Cardiovascular: Normal rate, regular rhythm, normal heart sounds and intact distal pulses.   No murmur heard. Pulmonary/Chest: Effort normal and breath sounds normal. No respiratory distress.  Musculoskeletal: Normal range of motion.  Lymphadenopathy:    She has cervical adenopathy.  Neurological: She is alert and oriented to person, place, and time. She exhibits normal muscle tone. Coordination normal.  Skin: Skin is warm and dry. No rash noted.  Nursing note and vitals reviewed.   ED Course  Procedures (including critical care time) Labs Review Labs Reviewed - No data to display  Imaging Review No results found.   EKG Interpretation None      MDM   Final diagnoses:  Tinea capitis    Pt is well appearing.  Rash to scalp likely related to tinea.  Pt has a PMD, will begin tx with griseofulvin with understanding that she will arrange f/u with PMD for continued care.  She agrees to tx plan and appears stable for d/c    Towanda Hornstein L. Vanessa [place], PA-C 12/12/14 [place], MD 12/13/14 [phone]

## 2014-12-16 ENCOUNTER — Ambulatory Visit (INDEPENDENT_AMBULATORY_CARE_PROVIDER_SITE_OTHER): Payer: 59 | Admitting: Adult Health

## 2014-12-16 ENCOUNTER — Encounter: Payer: Self-pay | Admitting: Adult Health

## 2014-12-16 VITALS — BP 144/78 | Ht 62.0 in | Wt 142.0 lb

## 2014-12-16 DIAGNOSIS — R300 Dysuria: Secondary | ICD-10-CM

## 2014-12-16 DIAGNOSIS — Z1389 Encounter for screening for other disorder: Secondary | ICD-10-CM

## 2014-12-16 LAB — POCT URINALYSIS DIPSTICK
Blood, UA: NEGATIVE
Glucose, UA: NEGATIVE
KETONES UA: NEGATIVE
NITRITE UA: NEGATIVE
Protein, UA: NEGATIVE

## 2014-12-16 MED ORDER — SULFAMETHOXAZOLE-TRIMETHOPRIM 800-160 MG PO TABS: 1.0000 | ORAL_TABLET | Freq: Two times a day (BID) | ORAL | Status: DC

## 2014-12-16 NOTE — Patient Instructions (Signed)
Increase water Take septra ds

## 2014-12-16 NOTE — Progress Notes (Signed)
Subjective:     Patient ID: Jenna Wade, female   DOB: [birth year], 64 y.o.   MRN: [id number]  HPI Jahnyla is a 64 year old black female in complaining of burning with urination and low back pain on and off x 3 weeks, no fever.Has occasional headache.Denies any vaginal discharge or itching.  Review of Systems  Patient denies any hearing loss, fatigue, blurred vision, shortness of breath, chest pain, abdominal pain, problems with bowel movements, or intercourse. No joint pain or mood swings.See HPI for positives.  Reviewed past medical,surgical, social and family history. Reviewed medications and allergies.     Objective:   Physical Exam BP 144/78 mmHg  Ht 5\' 2"  (1.575 m)  Wt 142 lb (64.411 kg)  BMI 25.97 kg/m2  LMP 10/31/2009   urine dipstick trace leuks, Skin warm and dry,No CVAT and no tenderness over bladder or abdomen, which is soft and non tender, no HSM.She does not want urine sent for culture.  Assessment:     Burning with urination    Plan:     Rx septra ds 1 bid x 7 days Push fluids   Follow up prn, call as needed

## 2015-01-19 ENCOUNTER — Encounter: Payer: Self-pay | Admitting: Gastroenterology

## 2015-09-13 ENCOUNTER — Other Ambulatory Visit (HOSPITAL_COMMUNITY): Payer: Self-pay | Admitting: Family Medicine

## 2015-09-13 DIAGNOSIS — Z1231 Encounter for screening mammogram for malignant neoplasm of breast: Secondary | ICD-10-CM

## 2015-10-18 ENCOUNTER — Ambulatory Visit (HOSPITAL_COMMUNITY)
Admission: RE | Admit: 2015-10-18 | Discharge: 2015-10-18 | Disposition: A | Discharge status: Home / Self Care | Payer: 59 | Source: Ambulatory Visit | Attending: Family Medicine | Admitting: Family Medicine

## 2015-10-18 DIAGNOSIS — Z1231 Encounter for screening mammogram for malignant neoplasm of breast: Secondary | ICD-10-CM | POA: Diagnosis present

## 2015-11-29 ENCOUNTER — Emergency Department (HOSPITAL_COMMUNITY)
Admission: EM | Admit: 2015-11-29 | Discharge: 2015-11-29 | Disposition: A | Discharge status: Home / Self Care | Payer: Self-pay | Attending: Emergency Medicine | Admitting: Emergency Medicine

## 2015-11-29 ENCOUNTER — Encounter (HOSPITAL_COMMUNITY): Payer: Self-pay | Admitting: *Deleted

## 2015-11-29 DIAGNOSIS — Z8709 Personal history of other diseases of the respiratory system: Secondary | ICD-10-CM | POA: Insufficient documentation

## 2015-11-29 DIAGNOSIS — Z8719 Personal history of other diseases of the digestive system: Secondary | ICD-10-CM | POA: Insufficient documentation

## 2015-11-29 DIAGNOSIS — I10 Essential (primary) hypertension: Secondary | ICD-10-CM | POA: Insufficient documentation

## 2015-11-29 DIAGNOSIS — E785 Hyperlipidemia, unspecified: Secondary | ICD-10-CM | POA: Insufficient documentation

## 2015-11-29 DIAGNOSIS — H9202 Otalgia, left ear: Secondary | ICD-10-CM | POA: Insufficient documentation

## 2015-11-29 DIAGNOSIS — Z79899 Other long term (current) drug therapy: Secondary | ICD-10-CM | POA: Insufficient documentation

## 2015-11-29 DIAGNOSIS — Z792 Long term (current) use of antibiotics: Secondary | ICD-10-CM | POA: Insufficient documentation

## 2015-11-29 DIAGNOSIS — E039 Hypothyroidism, unspecified: Secondary | ICD-10-CM | POA: Insufficient documentation

## 2015-11-29 MED ORDER — AMOXICILLIN 500 MG PO CAPS: 500.0000 mg | ORAL_CAPSULE | Freq: Three times a day (TID) | ORAL | Status: DC

## 2015-11-29 MED ORDER — FLUCONAZOLE 150 MG PO TABS: 150.0000 mg | ORAL_TABLET | Freq: Once | ORAL | Status: DC

## 2015-11-29 NOTE — ED Notes (Signed)
Pt comes in with left ear pain that started 3 days ago. Pt denies any recent nasal congestion. Pt denies n/v/d.

## 2015-11-29 NOTE — Discharge Instructions (Signed)
Take the prescribed medication as directed.  Take diflucan if you begin experiencing symptoms of yeast infection. Follow-up with your primary care physician. Return to the ED for new or worsening symptoms.

## 2015-11-29 NOTE — ED Provider Notes (Signed)
CSN: [id number]     Arrival date & time 11/29/15  1234 History  By signing my name below, I, Hilda Lias, attest that this documentation has been prepared under the direction and in the presence of Quincy Carnes, PA-C. Electronically Signed: Hilda Lias, ED Scribe. 11/29/2015. 12:51 PM.    Chief Complaint  Patient presents with  . Otalgia      Patient is a 65 y.o. female presenting with ear pain. The history is provided by the patient. No language interpreter was used.  Otalgia Associated symptoms: no fever     HPI Comments: Jenna Wade is a 65 y.o. female who presents to the Emergency Department complaining of constant left ear pain that began three days ago. Pt states she has taken Ibuprofen and Advil, and has put some sort of topical oil in it with no relief. Pt states she called her PCP today and was told he was not in his office, so she came to ED. Pt denies any trouble with her right ear, denies fever.  VSS.  Past Medical History  Diagnosis Date  . Hypertension   . Thyroid disease   . Hyperlipidemia   . History of mammogram   . Bronchitis   . Constipation 04/21/2014  . Burning with urination 05/07/2014  . Hematuria 05/07/2014  . Hypothyroidism    Past Surgical History  Procedure Laterality Date  . Exploratory laparoscopy      remote past  . Tubal ligation    . Colonoscopy N/A 05/28/2014    Procedure: COLONOSCOPY;  Surgeon: Danie Binder, MD;  Location: [facility];  Service: Endoscopy;  Laterality: N/A;  8:00   Family History  Problem Relation Age of Onset  . Hypertension Mother   . Hyperlipidemia Mother   . Other Mother     pacemaker  . Hypertension Father   . Heart disease Father   . Heart attack Father   . Hypertension Sister   . Hypertension Daughter   . Hypertension Son   . Hypertension Brother   . Hypertension Sister   . Hypertension Sister   . Hypertension Sister   . Hypertension Sister   . Lupus Sister   . Colon cancer Neg Hx    Social History   Substance Use Topics  . Smoking status: Never Smoker   . Smokeless tobacco: Never Used     Comment: Never smoked  . Alcohol Use: Yes     Comment: special occ   OB History    Gravida Para Term Preterm AB TAB SAB Ectopic Multiple Living   2 2        2      Review of Systems  Constitutional: Negative for fever and chills.  HENT: Positive for ear pain.   All other systems reviewed and are negative.     Allergies  Fish allergy and Other  Home Medications   Prior to Admission medications   Medication Sig Start Date End Date Taking? Authorizing Provider  atenolol (TENORMIN) 50 MG tablet Take 50 mg by mouth daily.      Historical Provider, MD  griseofulvin (GRIFULVIN V) 500 MG tablet Take 1 tablet (500 mg total) by mouth daily. 12/11/14   Tammy Triplett, PA-C  ibuprofen (ADVIL,MOTRIN) 200 MG tablet Take 400 mg by mouth every 6 (six) hours as needed. Pain     Historical Provider, MD  levothyroxine (SYNTHROID, LEVOTHROID) 75 MCG tablet Take 75 mcg by mouth daily.    Historical Provider, MD  Linaclotide Rolan Lipa) [phone]  MCG CAPS capsule Take 1 capsule (145 mcg total) by mouth daily. 30 minutes before breakfast Patient not taking: Reported on 12/16/2014 05/25/14   Orvil Feil, NP  lisinopril-hydrochlorothiazide (PRINZIDE,ZESTORETIC) 20-12.5 MG per tablet Take 1 tablet by mouth daily.      Historical Provider, MD  pravastatin (PRAVACHOL) 40 MG tablet Take 40 mg by mouth daily.    Historical Provider, MD  sulfamethoxazole-trimethoprim (BACTRIM DS,SEPTRA DS) 800-160 MG per tablet Take 1 tablet by mouth 2 (two) times daily. 12/16/14   Estill Dooms, NP  verapamil (CALAN-SR) 240 MG CR tablet Take 240 mg by mouth daily.      Historical Provider, MD   BP 149/61 mmHg  Pulse 52  Temp(Src) 98 F (36.7 C) (Oral)  Resp 16  Ht 5\' 2"  (1.575 m)  Wt 62.596 kg  BMI 25.23 kg/m2  SpO2 100%  LMP 10/31/2009   Physical Exam  Constitutional: She is oriented to person, place, and time. She appears  well-developed and well-nourished.  HENT:  Head: Normocephalic and atraumatic.  Right Ear: Hearing, tympanic membrane and ear canal normal.  Left Ear: Hearing normal. There is tenderness. Tympanic membrane is erythematous (mild). Tympanic membrane is not perforated, not retracted and not bulging.  Mouth/Throat: Oropharynx is clear and moist.  Left EAC and TM mildly erythematous; no signs of TM rupture; mastoid non-tender; + pain with movement of left external ear Right ear WNL  Eyes: Conjunctivae and EOM are normal. Pupils are equal, round, and reactive to light.  Neck: Normal range of motion.  Cardiovascular: Normal rate, regular rhythm and normal heart sounds.   Pulmonary/Chest: Effort normal and breath sounds normal.  Abdominal: Soft. Bowel sounds are normal.  Musculoskeletal: Normal range of motion.  Neurological: She is alert and oriented to person, place, and time.  Skin: Skin is warm and dry.  Psychiatric: She has a normal mood and affect.  Nursing note and vitals reviewed.   ED Course  Procedures (including critical care time)  DIAGNOSTIC STUDIES: Oxygen Saturation is 100% on room air, normal by my interpretation.    COORDINATION OF CARE: 12:41 PM Discussed treatment plan with pt at bedside and pt agreed to plan.   Labs Review Labs Reviewed - No data to display  Imaging Review No results found. I have personally reviewed and evaluated these images and lab results as part of my medical decision-making.   EKG Interpretation None      MDM   Final diagnoses:  Left ear pain   65 y.o. F here with left ear pain x 3 days.  Patient afebrile, non-toxic in appearance.  On exam her left EAC and TM are mildly erythematous. There are no signs of TM rupture or mastoiditis. Her right ear is normal. Patient may be developing an early otitis media. Will start on antibiotics. She states she generally gets yeast infection when she takes antibiotics, have read for Diflucan if  needed. Patient will follow-up with her PCP in a few days for recheck.  Discussed plan with patient, he/she acknowledged understanding and agreed with plan of care.  Return precautions given for new or worsening symptoms.  I personally performed the services described in this documentation, which was scribed in my presence. The recorded information has been reviewed and is accurate.  Larene Pickett, PA-C 11/29/15 1338  Ripley Fraise, MD 11/29/15 [phone]

## 2015-12-11 NOTE — Progress Notes (Signed)
REVIEWED-NO ADDITIONAL RECOMMENDATIONS. 

## 2016-08-10 ENCOUNTER — Encounter: Payer: Self-pay | Admitting: Obstetrics & Gynecology

## 2016-08-10 ENCOUNTER — Ambulatory Visit (INDEPENDENT_AMBULATORY_CARE_PROVIDER_SITE_OTHER): Payer: Self-pay | Admitting: Obstetrics & Gynecology

## 2016-08-10 VITALS — BP 110/64 | HR 74 | Ht 62.0 in | Wt 137.0 lb

## 2016-08-10 DIAGNOSIS — N3000 Acute cystitis without hematuria: Secondary | ICD-10-CM

## 2016-08-10 DIAGNOSIS — R300 Dysuria: Secondary | ICD-10-CM

## 2016-08-10 LAB — POCT URINALYSIS DIPSTICK
GLUCOSE UA: NEGATIVE
Ketones, UA: NEGATIVE
Nitrite, UA: POSITIVE
Protein, UA: NEGATIVE
RBC UA: NEGATIVE

## 2016-08-10 MED ORDER — FLUCONAZOLE 150 MG PO TABS: 150.0000 mg | ORAL_TABLET | Freq: Once | ORAL | 0 refills | Status: AC

## 2016-08-10 MED ORDER — CIPROFLOXACIN HCL 500 MG PO TABS: 500.0000 mg | ORAL_TABLET | Freq: Two times a day (BID) | ORAL | 0 refills | Status: DC

## 2016-08-10 NOTE — Addendum Note (Signed)
Addended by: Doyne Keel on: 08/10/2016 03:48 PM   Modules accepted: Orders

## 2016-08-10 NOTE — Progress Notes (Signed)
Chief Complaint  Patient presents with  . burning with urination, lower back pain    Blood pressure 110/64, pulse 74, height 5\' 2"  (1.575 m), weight 137 lb (62.1 kg), last menstrual period 10/31/2009.  65 y.o. G2P2 Patient's last menstrual period was 10/31/2009. The current method of family planning is post menopausal status.  Outpatient Encounter Prescriptions as of 08/10/2016  Medication Sig  . ibuprofen (ADVIL,MOTRIN) 200 MG tablet Take 400 mg by mouth every 6 (six) hours as needed. Pain   . levothyroxine (SYNTHROID, LEVOTHROID) 75 MCG tablet Take 75 mcg by mouth daily.  Marland Kitchen lisinopril-hydrochlorothiazide (PRINZIDE,ZESTORETIC) 20-12.5 MG per tablet Take 1 tablet by mouth daily.    . simvastatin (ZOCOR) 40 MG tablet Take 40 mg by mouth daily.  . verapamil (CALAN-SR) 240 MG CR tablet Take 240 mg by mouth daily.    Marland Kitchen atenolol (TENORMIN) 50 MG tablet Take 50 mg by mouth daily.    . ciprofloxacin (CIPRO) 500 MG tablet Take 1 tablet (500 mg total) by mouth 2 (two) times daily.  . fluconazole (DIFLUCAN) 150 MG tablet Take 1 tablet (150 mg total) by mouth once. Repeat in 72 hours if needed (Patient not taking: Reported on 08/10/2016)  . fluconazole (DIFLUCAN) 150 MG tablet Take 1 tablet (150 mg total) by mouth once. Take the second tablet 3 days after the first one.  . [DISCONTINUED] amoxicillin (AMOXIL) 500 MG capsule Take 1 capsule (500 mg total) by mouth 3 (three) times daily. (Patient not taking: Reported on 08/10/2016)  . [DISCONTINUED] griseofulvin (GRIFULVIN V) 500 MG tablet Take 1 tablet (500 mg total) by mouth daily. (Patient not taking: Reported on 08/10/2016)  . [DISCONTINUED] Linaclotide (LINZESS) 145 MCG CAPS capsule Take 1 capsule (145 mcg total) by mouth daily. 30 minutes before breakfast (Patient not taking: Reported on 08/10/2016)  . [DISCONTINUED] pravastatin (PRAVACHOL) 40 MG tablet Take 40 mg by mouth daily.  . [DISCONTINUED] sulfamethoxazole-trimethoprim (BACTRIM  DS,SEPTRA DS) 800-160 MG per tablet Take 1 tablet by mouth 2 (two) times daily. (Patient not taking: Reported on 08/10/2016)   No facility-administered encounter medications on file as of 08/10/2016.     Subjective Pt has had burning frequency lower back pain for the past week or so Had similar episode a year or so ago Has not had them recurrently I the past No blood seen Objective Urinalysis + nitrates  Pertinent ROS   Labs or studies     Impression Diagnoses this Encounter::   ICD-9-CM ICD-10-CM   1. Acute cystitis without hematuria 595.0 N30.00    cipro 500 BID x 7days diflucan 150 x2 for the yeast post antibiotics  2. Burning with urination 788.1 R30.0 POCT urinalysis dipstick     Urine culture    Established relevant diagnosis(es):   Plan/Recommendations: Meds ordered this encounter  Medications  . simvastatin (ZOCOR) 40 MG tablet    Sig: Take 40 mg by mouth daily.  . ciprofloxacin (CIPRO) 500 MG tablet    Sig: Take 1 tablet (500 mg total) by mouth 2 (two) times daily.    Dispense:  14 tablet    Refill:  0  . fluconazole (DIFLUCAN) 150 MG tablet    Sig: Take 1 tablet (150 mg total) by mouth once. Take the second tablet 3 days after the first one.    Dispense:  2 tablet    Refill:  0    Labs or Scans Ordered: Orders Placed This Encounter  Procedures  . Urine culture  .  POCT urinalysis dipstick    Management::   Follow up Return if symptoms worsen or fail to improve.        All questions were answered.  Past Medical History:  Diagnosis Date  . Bronchitis   . Burning with urination 05/07/2014  . Constipation 04/21/2014  . Hematuria 05/07/2014  . History of mammogram   . Hyperlipidemia   . Hypertension   . Hypothyroidism   . Thyroid disease     Past Surgical History:  Procedure Laterality Date  . COLONOSCOPY N/A 05/28/2014   Procedure: COLONOSCOPY;  Surgeon: Danie Binder, MD;  Location: [facility];  Service: Endoscopy;  Laterality:  N/A;  8:00  . EXPLORATORY laparoscopy     remote past  . TUBAL LIGATION      OB History    Gravida Para Term Preterm AB Living   2 2       2    SAB TAB Ectopic Multiple Live Births                  Allergies  Allergen Reactions  . Fish Allergy Anaphylaxis  . Other     Unable to recall - "it's on record at [facility] office"    Social History   Social History  . Marital status: Married    Spouse name: N/A  . Number of children: N/A  . Years of education: N/A   Social History Main Topics  . Smoking status: Never Smoker  . Smokeless tobacco: Never Used     Comment: Never smoked  . Alcohol use Yes     Comment: special occ  . Drug use: No  . Sexual activity: Yes    Partners: Male    Birth control/ protection: Post-menopausal   Other Topics Concern  . None   Social History Narrative  . None    Family History  Problem Relation Age of Onset  . Hypertension Mother   . Hyperlipidemia Mother   . Other Mother     pacemaker  . Hypertension Father   . Heart disease Father   . Heart attack Father   . Hypertension Sister   . Hypertension Daughter   . Hypertension Son   . Hypertension Brother   . Hypertension Sister   . Hypertension Sister   . Hypertension Sister   . Hypertension Sister   . Lupus Sister   . Colon cancer Neg Hx

## 2017-04-18 ENCOUNTER — Other Ambulatory Visit (HOSPITAL_COMMUNITY): Payer: Self-pay | Admitting: Family Medicine

## 2017-04-18 DIAGNOSIS — Z1231 Encounter for screening mammogram for malignant neoplasm of breast: Secondary | ICD-10-CM

## 2017-04-20 ENCOUNTER — Ambulatory Visit (HOSPITAL_COMMUNITY)
Admission: RE | Admit: 2017-04-20 | Discharge: 2017-04-20 | Disposition: A | Discharge status: Home / Self Care | Payer: BLUE CROSS/BLUE SHIELD | Source: Ambulatory Visit | Attending: Family Medicine | Admitting: Family Medicine

## 2017-04-20 DIAGNOSIS — Z1231 Encounter for screening mammogram for malignant neoplasm of breast: Secondary | ICD-10-CM

## 2017-04-25 ENCOUNTER — Encounter: Payer: Self-pay | Admitting: Gastroenterology

## 2018-06-18 ENCOUNTER — Other Ambulatory Visit (HOSPITAL_COMMUNITY): Payer: Self-pay | Admitting: Family Medicine

## 2018-06-18 DIAGNOSIS — Z1231 Encounter for screening mammogram for malignant neoplasm of breast: Secondary | ICD-10-CM

## 2018-06-24 ENCOUNTER — Other Ambulatory Visit: Payer: Self-pay

## 2018-06-24 ENCOUNTER — Telehealth: Payer: Self-pay | Admitting: Adult Health

## 2018-06-24 ENCOUNTER — Encounter: Payer: Self-pay | Admitting: Adult Health

## 2018-06-24 ENCOUNTER — Ambulatory Visit: Payer: Medicare HMO | Admitting: Adult Health

## 2018-06-24 VITALS — BP 123/65 | HR 81 | Ht 62.0 in | Wt 136.0 lb

## 2018-06-24 DIAGNOSIS — R300 Dysuria: Secondary | ICD-10-CM

## 2018-06-24 DIAGNOSIS — R350 Frequency of micturition: Secondary | ICD-10-CM | POA: Diagnosis not present

## 2018-06-24 DIAGNOSIS — R319 Hematuria, unspecified: Secondary | ICD-10-CM

## 2018-06-24 LAB — POCT URINALYSIS DIPSTICK OB
Glucose, UA: NEGATIVE — AB
KETONES UA: NEGATIVE
Nitrite, UA: NEGATIVE
POC,PROTEIN,UA: NEGATIVE

## 2018-06-24 MED ORDER — SULFAMETHOXAZOLE-TRIMETHOPRIM 800-160 MG PO TABS: 1.0000 | ORAL_TABLET | Freq: Two times a day (BID) | ORAL | 0 refills | Status: DC

## 2018-06-24 NOTE — Telephone Encounter (Signed)
Pt says she has UTI with blood in urine, to make appt today

## 2018-06-24 NOTE — Telephone Encounter (Signed)
Patient called stating that she would like for [facility] to call her in something for her UTI, pt states that she is having a little bit of blood in her urine. Please contact pt

## 2018-06-24 NOTE — Progress Notes (Signed)
  Subjective:     Patient ID: Jenna Wade, female   DOB: 24-Jun-1951, 67 y.o.   MRN: [id number]  HPI Jenna Wade is a 67 year old black female, worked in for burning with urination and urinary frequency the weekend and low back pain.  Review of Systems +burning with urination +urinary frequency this weekend Low back pain Reviewed past medical,surgical, social and family history. Reviewed medications and allergies.     Objective:   Physical Exam BP 123/65 (BP Location: Right Arm, Patient Position: Sitting, Cuff Size: Normal)   Pulse 81   Ht 5\' 2"  (1.575 m)   Wt 136 lb (61.7 kg)   LMP 10/31/2009   BMI 24.87 kg/m urine dipstick +blood and leuks.Skinw arm and dry, bladder non tender and no CVAT.Not enough urine for culture will treat for possible UTI.     Assessment:     1. Burning with urination   2. Hematuria, unspecified type   3. Urinary frequency       Plan:     Push water  Meds ordered this encounter  Medications  . sulfamethoxazole-trimethoprim (BACTRIM DS,SEPTRA DS) 800-160 MG tablet    Sig: Take 1 tablet by mouth 2 (two) times daily. Take 1 bid    Dispense:  14 tablet    Refill:  0    Order Specific Question:   Supervising Provider    Answer:   Tania Ade H [[id number]]  F/U prn

## 2018-06-26 ENCOUNTER — Encounter (HOSPITAL_COMMUNITY): Payer: Self-pay

## 2018-06-26 ENCOUNTER — Ambulatory Visit (HOSPITAL_COMMUNITY)
Admission: RE | Admit: 2018-06-26 | Discharge: 2018-06-26 | Disposition: A | Discharge status: Home / Self Care | Payer: Medicare HMO | Source: Ambulatory Visit | Attending: Family Medicine | Admitting: Family Medicine

## 2018-06-26 DIAGNOSIS — Z1231 Encounter for screening mammogram for malignant neoplasm of breast: Secondary | ICD-10-CM | POA: Insufficient documentation

## 2018-12-26 ENCOUNTER — Ambulatory Visit: Payer: Medicare HMO | Admitting: Adult Health

## 2018-12-26 ENCOUNTER — Encounter: Payer: Self-pay | Admitting: Adult Health

## 2018-12-26 VITALS — BP 141/86 | HR 78 | Ht 63.0 in | Wt 135.0 lb

## 2018-12-26 DIAGNOSIS — M545 Low back pain, unspecified: Secondary | ICD-10-CM

## 2018-12-26 DIAGNOSIS — R300 Dysuria: Secondary | ICD-10-CM | POA: Diagnosis not present

## 2018-12-26 MED ORDER — FLUCONAZOLE 150 MG PO TABS: ORAL_TABLET | ORAL | 1 refills | Status: DC

## 2018-12-26 MED ORDER — CIPROFLOXACIN HCL 500 MG PO TABS: 500.0000 mg | ORAL_TABLET | Freq: Two times a day (BID) | ORAL | 0 refills | Status: DC

## 2018-12-26 NOTE — Progress Notes (Addendum)
Patient ID: Jenna Wade, female   DOB: 08-Nov-1950, 68 y.o.   MRN: [id number] History of Present Illness: Jenna Wade is a 68 year old black female in complaining of burning with urination and low back when pees, over a week now. PCP is Dr Nevada Crane.    Current Medications, Allergies, Past Medical History, Past Surgical History, Family History and Social History were reviewed in [employer] record.     Review of Systems: Burning with urination and low back when pees    Physical Exam:BP (!) 141/86 (BP Location: Right Arm, Patient Position: Sitting, Cuff Size: Normal)   Pulse 78   Ht 5\' 3"  (1.6 m)   Wt 135 lb (61.2 kg)   LMP 10/31/2009   BMI 23.91 kg/m   Could not pee now but will give cup to collect urine at home and bring back  General:  Well developed, well nourished, no acute distress Skin:  Warm and dry Lungs; Clear to auscultation bilaterally Cardiovascular: Regular rate and rhythm Abdomen:  Soft, non tender, no bladder tenderness, no CVAT Psych:  No mood changes, alert and cooperative,seems happy Fall risk is low.  Impression: Burning with urination  Low back pain  ?UTI    Plan: Will send urine for UA C&S when she bring sback Meds ordered this encounter  Medications  . ciprofloxacin (CIPRO) 500 MG tablet    Sig: Take 1 tablet (500 mg total) by mouth 2 (two) times daily.    Dispense:  14 tablet    Refill:  0    Order Specific Question:   Supervising Provider    Answer:   Elonda Husky, LUTHER H [[id number]]  . fluconazole (DIFLUCAN) 150 MG tablet    Sig: Take 1 now and repat 1 in 3 days if needed    Dispense:  2 tablet    Refill:  1    Order Specific Question:   Supervising Provider    Answer:   Tania Ade H [[id number]]  F/U prn Push fluids

## 2018-12-27 ENCOUNTER — Other Ambulatory Visit: Payer: Medicare HMO

## 2018-12-27 DIAGNOSIS — R300 Dysuria: Secondary | ICD-10-CM

## 2018-12-29 LAB — URINE CULTURE: ORGANISM ID, BACTERIA: NO GROWTH

## 2019-03-27 DIAGNOSIS — I951 Orthostatic hypotension: Secondary | ICD-10-CM | POA: Diagnosis not present

## 2019-03-27 DIAGNOSIS — R5383 Other fatigue: Secondary | ICD-10-CM | POA: Diagnosis not present

## 2019-03-27 DIAGNOSIS — R42 Dizziness and giddiness: Secondary | ICD-10-CM | POA: Diagnosis not present

## 2019-04-07 DIAGNOSIS — M26609 Unspecified temporomandibular joint disorder, unspecified side: Secondary | ICD-10-CM | POA: Diagnosis not present

## 2019-04-07 DIAGNOSIS — H60599 Other noninfective acute otitis externa, unspecified ear: Secondary | ICD-10-CM | POA: Diagnosis not present

## 2019-04-16 DIAGNOSIS — L930 Discoid lupus erythematosus: Secondary | ICD-10-CM | POA: Diagnosis not present

## 2019-04-16 DIAGNOSIS — Z79899 Other long term (current) drug therapy: Secondary | ICD-10-CM | POA: Diagnosis not present

## 2019-04-23 DIAGNOSIS — I10 Essential (primary) hypertension: Secondary | ICD-10-CM | POA: Diagnosis not present

## 2019-04-23 DIAGNOSIS — R5383 Other fatigue: Secondary | ICD-10-CM | POA: Diagnosis not present

## 2019-04-23 DIAGNOSIS — I951 Orthostatic hypotension: Secondary | ICD-10-CM | POA: Diagnosis not present

## 2019-04-23 DIAGNOSIS — H60599 Other noninfective acute otitis externa, unspecified ear: Secondary | ICD-10-CM | POA: Diagnosis not present

## 2019-04-23 DIAGNOSIS — R42 Dizziness and giddiness: Secondary | ICD-10-CM | POA: Diagnosis not present

## 2019-04-23 DIAGNOSIS — M26609 Unspecified temporomandibular joint disorder, unspecified side: Secondary | ICD-10-CM | POA: Diagnosis not present

## 2019-04-23 DIAGNOSIS — R600 Localized edema: Secondary | ICD-10-CM | POA: Diagnosis not present

## 2019-04-23 DIAGNOSIS — M545 Low back pain: Secondary | ICD-10-CM | POA: Diagnosis not present

## 2019-05-21 DIAGNOSIS — L930 Discoid lupus erythematosus: Secondary | ICD-10-CM | POA: Diagnosis not present

## 2019-05-22 DIAGNOSIS — L659 Nonscarring hair loss, unspecified: Secondary | ICD-10-CM | POA: Diagnosis not present

## 2019-05-22 DIAGNOSIS — R609 Edema, unspecified: Secondary | ICD-10-CM | POA: Diagnosis not present

## 2019-05-22 DIAGNOSIS — R42 Dizziness and giddiness: Secondary | ICD-10-CM | POA: Diagnosis not present

## 2019-07-02 ENCOUNTER — Other Ambulatory Visit (HOSPITAL_COMMUNITY): Payer: Self-pay | Admitting: Internal Medicine

## 2019-07-02 DIAGNOSIS — Z1231 Encounter for screening mammogram for malignant neoplasm of breast: Secondary | ICD-10-CM

## 2019-07-11 DIAGNOSIS — I951 Orthostatic hypotension: Secondary | ICD-10-CM | POA: Diagnosis not present

## 2019-07-11 DIAGNOSIS — R609 Edema, unspecified: Secondary | ICD-10-CM | POA: Diagnosis not present

## 2019-07-11 DIAGNOSIS — I10 Essential (primary) hypertension: Secondary | ICD-10-CM | POA: Diagnosis not present

## 2019-07-16 ENCOUNTER — Ambulatory Visit (HOSPITAL_COMMUNITY): Payer: Medicare HMO

## 2019-07-17 ENCOUNTER — Ambulatory Visit (HOSPITAL_COMMUNITY): Payer: Medicare HMO

## 2019-07-22 DIAGNOSIS — L930 Discoid lupus erythematosus: Secondary | ICD-10-CM | POA: Diagnosis not present

## 2019-07-25 DIAGNOSIS — I951 Orthostatic hypotension: Secondary | ICD-10-CM | POA: Diagnosis not present

## 2019-07-25 DIAGNOSIS — I10 Essential (primary) hypertension: Secondary | ICD-10-CM | POA: Diagnosis not present

## 2019-07-25 DIAGNOSIS — Z1329 Encounter for screening for other suspected endocrine disorder: Secondary | ICD-10-CM | POA: Diagnosis not present

## 2019-07-30 DIAGNOSIS — Z0000 Encounter for general adult medical examination without abnormal findings: Secondary | ICD-10-CM | POA: Diagnosis not present

## 2019-07-30 DIAGNOSIS — Z83511 Family history of glaucoma: Secondary | ICD-10-CM | POA: Diagnosis not present

## 2019-07-30 DIAGNOSIS — I951 Orthostatic hypotension: Secondary | ICD-10-CM | POA: Diagnosis not present

## 2019-07-30 DIAGNOSIS — Z712 Person consulting for explanation of examination or test findings: Secondary | ICD-10-CM | POA: Diagnosis not present

## 2019-07-30 DIAGNOSIS — K581 Irritable bowel syndrome with constipation: Secondary | ICD-10-CM | POA: Diagnosis not present

## 2019-07-30 DIAGNOSIS — R609 Edema, unspecified: Secondary | ICD-10-CM | POA: Diagnosis not present

## 2019-07-30 DIAGNOSIS — R5383 Other fatigue: Secondary | ICD-10-CM | POA: Diagnosis not present

## 2019-07-30 DIAGNOSIS — E039 Hypothyroidism, unspecified: Secondary | ICD-10-CM | POA: Diagnosis not present

## 2019-07-30 DIAGNOSIS — E782 Mixed hyperlipidemia: Secondary | ICD-10-CM | POA: Diagnosis not present

## 2019-07-30 DIAGNOSIS — H4010X0 Unspecified open-angle glaucoma, stage unspecified: Secondary | ICD-10-CM | POA: Diagnosis not present

## 2019-07-30 DIAGNOSIS — Z8639 Personal history of other endocrine, nutritional and metabolic disease: Secondary | ICD-10-CM | POA: Diagnosis not present

## 2019-07-30 DIAGNOSIS — Z23 Encounter for immunization: Secondary | ICD-10-CM | POA: Diagnosis not present

## 2019-07-30 DIAGNOSIS — E785 Hyperlipidemia, unspecified: Secondary | ICD-10-CM | POA: Diagnosis not present

## 2019-07-30 DIAGNOSIS — Z0001 Encounter for general adult medical examination with abnormal findings: Secondary | ICD-10-CM | POA: Diagnosis not present

## 2019-07-30 DIAGNOSIS — I10 Essential (primary) hypertension: Secondary | ICD-10-CM | POA: Diagnosis not present

## 2019-07-31 ENCOUNTER — Ambulatory Visit (HOSPITAL_COMMUNITY)
Admission: RE | Admit: 2019-07-31 | Discharge: 2019-07-31 | Disposition: A | Discharge status: Home / Self Care | Payer: Medicare HMO | Source: Ambulatory Visit | Attending: Internal Medicine | Admitting: Internal Medicine

## 2019-07-31 ENCOUNTER — Other Ambulatory Visit: Payer: Self-pay

## 2019-07-31 DIAGNOSIS — Z1231 Encounter for screening mammogram for malignant neoplasm of breast: Secondary | ICD-10-CM | POA: Insufficient documentation

## 2019-08-04 DIAGNOSIS — M545 Low back pain: Secondary | ICD-10-CM | POA: Diagnosis not present

## 2019-08-04 DIAGNOSIS — K5900 Constipation, unspecified: Secondary | ICD-10-CM | POA: Diagnosis not present

## 2019-08-14 DIAGNOSIS — I10 Essential (primary) hypertension: Secondary | ICD-10-CM | POA: Diagnosis not present

## 2019-08-14 DIAGNOSIS — I951 Orthostatic hypotension: Secondary | ICD-10-CM | POA: Diagnosis not present

## 2019-08-14 DIAGNOSIS — R609 Edema, unspecified: Secondary | ICD-10-CM | POA: Diagnosis not present

## 2019-09-03 DIAGNOSIS — R609 Edema, unspecified: Secondary | ICD-10-CM | POA: Diagnosis not present

## 2019-09-03 DIAGNOSIS — I10 Essential (primary) hypertension: Secondary | ICD-10-CM | POA: Diagnosis not present

## 2019-09-03 DIAGNOSIS — I951 Orthostatic hypotension: Secondary | ICD-10-CM | POA: Diagnosis not present

## 2019-10-20 DIAGNOSIS — L930 Discoid lupus erythematosus: Secondary | ICD-10-CM | POA: Diagnosis not present

## 2019-11-11 DIAGNOSIS — E7849 Other hyperlipidemia: Secondary | ICD-10-CM | POA: Diagnosis not present

## 2019-11-11 DIAGNOSIS — I10 Essential (primary) hypertension: Secondary | ICD-10-CM | POA: Diagnosis not present

## 2019-11-11 DIAGNOSIS — R609 Edema, unspecified: Secondary | ICD-10-CM | POA: Diagnosis not present

## 2019-11-11 DIAGNOSIS — I951 Orthostatic hypotension: Secondary | ICD-10-CM | POA: Diagnosis not present

## 2019-12-03 DIAGNOSIS — L930 Discoid lupus erythematosus: Secondary | ICD-10-CM | POA: Diagnosis not present

## 2020-01-06 DIAGNOSIS — E785 Hyperlipidemia, unspecified: Secondary | ICD-10-CM | POA: Diagnosis not present

## 2020-01-06 DIAGNOSIS — I951 Orthostatic hypotension: Secondary | ICD-10-CM | POA: Diagnosis not present

## 2020-01-06 DIAGNOSIS — R609 Edema, unspecified: Secondary | ICD-10-CM | POA: Diagnosis not present

## 2020-01-06 DIAGNOSIS — I10 Essential (primary) hypertension: Secondary | ICD-10-CM | POA: Diagnosis not present

## 2020-01-23 DIAGNOSIS — E7849 Other hyperlipidemia: Secondary | ICD-10-CM | POA: Diagnosis not present

## 2020-01-23 DIAGNOSIS — E039 Hypothyroidism, unspecified: Secondary | ICD-10-CM | POA: Diagnosis not present

## 2020-01-23 DIAGNOSIS — H4010X0 Unspecified open-angle glaucoma, stage unspecified: Secondary | ICD-10-CM | POA: Diagnosis not present

## 2020-01-23 DIAGNOSIS — Z0001 Encounter for general adult medical examination with abnormal findings: Secondary | ICD-10-CM | POA: Diagnosis not present

## 2020-01-23 DIAGNOSIS — Z0000 Encounter for general adult medical examination without abnormal findings: Secondary | ICD-10-CM | POA: Diagnosis not present

## 2020-01-23 DIAGNOSIS — E782 Mixed hyperlipidemia: Secondary | ICD-10-CM | POA: Diagnosis not present

## 2020-01-23 DIAGNOSIS — E785 Hyperlipidemia, unspecified: Secondary | ICD-10-CM | POA: Diagnosis not present

## 2020-01-23 DIAGNOSIS — I10 Essential (primary) hypertension: Secondary | ICD-10-CM | POA: Diagnosis not present

## 2020-01-23 DIAGNOSIS — H60599 Other noninfective acute otitis externa, unspecified ear: Secondary | ICD-10-CM | POA: Diagnosis not present

## 2020-01-28 DIAGNOSIS — E7849 Other hyperlipidemia: Secondary | ICD-10-CM | POA: Diagnosis not present

## 2020-01-28 DIAGNOSIS — I951 Orthostatic hypotension: Secondary | ICD-10-CM | POA: Diagnosis not present

## 2020-01-28 DIAGNOSIS — I10 Essential (primary) hypertension: Secondary | ICD-10-CM | POA: Diagnosis not present

## 2020-01-28 DIAGNOSIS — R609 Edema, unspecified: Secondary | ICD-10-CM | POA: Diagnosis not present

## 2020-02-05 ENCOUNTER — Ambulatory Visit: Payer: Medicare HMO | Attending: Internal Medicine

## 2020-02-05 DIAGNOSIS — Z23 Encounter for immunization: Secondary | ICD-10-CM

## 2020-02-05 NOTE — Progress Notes (Signed)
   Covid-19 Vaccination Clinic  Name:  Jenna Wade    MRN: [id number] DOB: 1951/07/12  02/05/2020  Jenna Wade was observed post Covid-19 immunization for 15 minutes without incident. She was provided with Vaccine Information Sheet and instruction to access the V-Safe system.   Jenna Wade was instructed to call 911 with any severe reactions post vaccine: Marland Kitchen Difficulty breathing  . Swelling of face and throat  . A fast heartbeat  . A bad rash all over body  . Dizziness and weakness   Immunizations Administered    Name Date Dose VIS Date Route   Moderna COVID-19 Vaccine 02/05/2020  8:25 AM 0.5 mL 09/30/2019 Intramuscular   Manufacturer: Moderna   Lot: [id number]   [id number]

## 2020-02-06 DIAGNOSIS — E782 Mixed hyperlipidemia: Secondary | ICD-10-CM | POA: Diagnosis not present

## 2020-02-06 DIAGNOSIS — E039 Hypothyroidism, unspecified: Secondary | ICD-10-CM | POA: Diagnosis not present

## 2020-02-06 DIAGNOSIS — H4010X0 Unspecified open-angle glaucoma, stage unspecified: Secondary | ICD-10-CM | POA: Diagnosis not present

## 2020-02-06 DIAGNOSIS — K581 Irritable bowel syndrome with constipation: Secondary | ICD-10-CM | POA: Diagnosis not present

## 2020-02-06 DIAGNOSIS — I10 Essential (primary) hypertension: Secondary | ICD-10-CM | POA: Diagnosis not present

## 2020-02-06 DIAGNOSIS — R7301 Impaired fasting glucose: Secondary | ICD-10-CM | POA: Diagnosis not present

## 2020-02-10 DIAGNOSIS — R609 Edema, unspecified: Secondary | ICD-10-CM | POA: Diagnosis not present

## 2020-02-10 DIAGNOSIS — E7849 Other hyperlipidemia: Secondary | ICD-10-CM | POA: Diagnosis not present

## 2020-02-10 DIAGNOSIS — I10 Essential (primary) hypertension: Secondary | ICD-10-CM | POA: Diagnosis not present

## 2020-02-10 DIAGNOSIS — I951 Orthostatic hypotension: Secondary | ICD-10-CM | POA: Diagnosis not present

## 2020-03-09 ENCOUNTER — Ambulatory Visit: Payer: Medicare HMO | Attending: Internal Medicine

## 2020-03-09 ENCOUNTER — Ambulatory Visit: Payer: Medicare HMO

## 2020-03-09 DIAGNOSIS — Z23 Encounter for immunization: Secondary | ICD-10-CM

## 2020-03-09 NOTE — Progress Notes (Signed)
   Covid-19 Vaccination Clinic  Name:  Jenna Wade    MRN: [id number] DOB: 08/31/1951  03/09/2020  [name] was observed post Covid-19 immunization for 15 minutes without incident. She was provided with Vaccine Information Sheet and instruction to access the V-Safe system.   [name] was instructed to call 911 with any severe reactions post vaccine: Marland Kitchen Difficulty breathing  . Swelling of face and throat  . A fast heartbeat  . A bad rash all over body  . Dizziness and weakness   Immunizations Administered    Name Date Dose VIS Date Route   Moderna COVID-19 Vaccine 03/09/2020  9:45 AM 0.5 mL 09/2019 Intramuscular   Manufacturer: Moderna   Lot: [id number]   [id number]

## 2020-03-26 DIAGNOSIS — E7849 Other hyperlipidemia: Secondary | ICD-10-CM | POA: Diagnosis not present

## 2020-03-26 DIAGNOSIS — R609 Edema, unspecified: Secondary | ICD-10-CM | POA: Diagnosis not present

## 2020-03-26 DIAGNOSIS — I10 Essential (primary) hypertension: Secondary | ICD-10-CM | POA: Diagnosis not present

## 2020-03-26 DIAGNOSIS — I951 Orthostatic hypotension: Secondary | ICD-10-CM | POA: Diagnosis not present

## 2020-04-28 DIAGNOSIS — R609 Edema, unspecified: Secondary | ICD-10-CM | POA: Diagnosis not present

## 2020-04-28 DIAGNOSIS — E7849 Other hyperlipidemia: Secondary | ICD-10-CM | POA: Diagnosis not present

## 2020-04-28 DIAGNOSIS — I10 Essential (primary) hypertension: Secondary | ICD-10-CM | POA: Diagnosis not present

## 2020-04-28 DIAGNOSIS — I951 Orthostatic hypotension: Secondary | ICD-10-CM | POA: Diagnosis not present

## 2020-06-24 DIAGNOSIS — I951 Orthostatic hypotension: Secondary | ICD-10-CM | POA: Diagnosis not present

## 2020-06-24 DIAGNOSIS — R609 Edema, unspecified: Secondary | ICD-10-CM | POA: Diagnosis not present

## 2020-06-24 DIAGNOSIS — E7849 Other hyperlipidemia: Secondary | ICD-10-CM | POA: Diagnosis not present

## 2020-06-24 DIAGNOSIS — I10 Essential (primary) hypertension: Secondary | ICD-10-CM | POA: Diagnosis not present

## 2020-07-01 DIAGNOSIS — R609 Edema, unspecified: Secondary | ICD-10-CM | POA: Diagnosis not present

## 2020-07-01 DIAGNOSIS — E039 Hypothyroidism, unspecified: Secondary | ICD-10-CM | POA: Diagnosis not present

## 2020-07-01 DIAGNOSIS — I951 Orthostatic hypotension: Secondary | ICD-10-CM | POA: Diagnosis not present

## 2020-07-01 DIAGNOSIS — I10 Essential (primary) hypertension: Secondary | ICD-10-CM | POA: Diagnosis not present

## 2020-07-01 DIAGNOSIS — E7849 Other hyperlipidemia: Secondary | ICD-10-CM | POA: Diagnosis not present

## 2020-07-19 DIAGNOSIS — Z0000 Encounter for general adult medical examination without abnormal findings: Secondary | ICD-10-CM | POA: Diagnosis not present

## 2020-07-19 DIAGNOSIS — N3000 Acute cystitis without hematuria: Secondary | ICD-10-CM | POA: Diagnosis not present

## 2020-07-19 DIAGNOSIS — I951 Orthostatic hypotension: Secondary | ICD-10-CM | POA: Diagnosis not present

## 2020-07-19 DIAGNOSIS — K5900 Constipation, unspecified: Secondary | ICD-10-CM | POA: Diagnosis not present

## 2020-07-19 DIAGNOSIS — K581 Irritable bowel syndrome with constipation: Secondary | ICD-10-CM | POA: Diagnosis not present

## 2020-07-19 DIAGNOSIS — Z8639 Personal history of other endocrine, nutritional and metabolic disease: Secondary | ICD-10-CM | POA: Diagnosis not present

## 2020-07-19 DIAGNOSIS — R5383 Other fatigue: Secondary | ICD-10-CM | POA: Diagnosis not present

## 2020-07-19 DIAGNOSIS — E7849 Other hyperlipidemia: Secondary | ICD-10-CM | POA: Diagnosis not present

## 2020-07-19 DIAGNOSIS — Z83511 Family history of glaucoma: Secondary | ICD-10-CM | POA: Diagnosis not present

## 2020-07-19 DIAGNOSIS — E785 Hyperlipidemia, unspecified: Secondary | ICD-10-CM | POA: Diagnosis not present

## 2020-07-19 DIAGNOSIS — Z0001 Encounter for general adult medical examination with abnormal findings: Secondary | ICD-10-CM | POA: Diagnosis not present

## 2020-07-22 ENCOUNTER — Other Ambulatory Visit (HOSPITAL_COMMUNITY): Payer: Self-pay | Admitting: Internal Medicine

## 2020-07-22 DIAGNOSIS — Z1231 Encounter for screening mammogram for malignant neoplasm of breast: Secondary | ICD-10-CM

## 2020-08-02 ENCOUNTER — Other Ambulatory Visit: Payer: Self-pay

## 2020-08-02 ENCOUNTER — Ambulatory Visit (HOSPITAL_COMMUNITY)
Admission: RE | Admit: 2020-08-02 | Discharge: 2020-08-02 | Disposition: A | Discharge status: Home / Self Care | Payer: Medicare HMO | Source: Ambulatory Visit | Attending: Internal Medicine | Admitting: Internal Medicine

## 2020-08-02 DIAGNOSIS — Z1231 Encounter for screening mammogram for malignant neoplasm of breast: Secondary | ICD-10-CM | POA: Insufficient documentation

## 2020-08-17 DIAGNOSIS — Z83511 Family history of glaucoma: Secondary | ICD-10-CM | POA: Diagnosis not present

## 2020-08-17 DIAGNOSIS — I951 Orthostatic hypotension: Secondary | ICD-10-CM | POA: Diagnosis not present

## 2020-08-17 DIAGNOSIS — Z0000 Encounter for general adult medical examination without abnormal findings: Secondary | ICD-10-CM | POA: Diagnosis not present

## 2020-08-17 DIAGNOSIS — Z0001 Encounter for general adult medical examination with abnormal findings: Secondary | ICD-10-CM | POA: Diagnosis not present

## 2020-08-17 DIAGNOSIS — Z8639 Personal history of other endocrine, nutritional and metabolic disease: Secondary | ICD-10-CM | POA: Diagnosis not present

## 2020-08-17 DIAGNOSIS — K581 Irritable bowel syndrome with constipation: Secondary | ICD-10-CM | POA: Diagnosis not present

## 2020-08-17 DIAGNOSIS — E7849 Other hyperlipidemia: Secondary | ICD-10-CM | POA: Diagnosis not present

## 2020-08-17 DIAGNOSIS — N3000 Acute cystitis without hematuria: Secondary | ICD-10-CM | POA: Diagnosis not present

## 2020-08-17 DIAGNOSIS — E785 Hyperlipidemia, unspecified: Secondary | ICD-10-CM | POA: Diagnosis not present

## 2020-08-20 DIAGNOSIS — K581 Irritable bowel syndrome with constipation: Secondary | ICD-10-CM | POA: Diagnosis not present

## 2020-08-20 DIAGNOSIS — Z83511 Family history of glaucoma: Secondary | ICD-10-CM | POA: Diagnosis not present

## 2020-08-20 DIAGNOSIS — Z8639 Personal history of other endocrine, nutritional and metabolic disease: Secondary | ICD-10-CM | POA: Diagnosis not present

## 2020-08-20 DIAGNOSIS — E7849 Other hyperlipidemia: Secondary | ICD-10-CM | POA: Diagnosis not present

## 2020-08-20 DIAGNOSIS — E782 Mixed hyperlipidemia: Secondary | ICD-10-CM | POA: Diagnosis not present

## 2020-08-20 DIAGNOSIS — H8110 Benign paroxysmal vertigo, unspecified ear: Secondary | ICD-10-CM | POA: Diagnosis not present

## 2020-08-20 DIAGNOSIS — Z0000 Encounter for general adult medical examination without abnormal findings: Secondary | ICD-10-CM | POA: Diagnosis not present

## 2020-08-20 DIAGNOSIS — R7301 Impaired fasting glucose: Secondary | ICD-10-CM | POA: Diagnosis not present

## 2020-08-20 DIAGNOSIS — I951 Orthostatic hypotension: Secondary | ICD-10-CM | POA: Diagnosis not present

## 2020-08-20 DIAGNOSIS — I10 Essential (primary) hypertension: Secondary | ICD-10-CM | POA: Diagnosis not present

## 2020-08-20 DIAGNOSIS — H4010X0 Unspecified open-angle glaucoma, stage unspecified: Secondary | ICD-10-CM | POA: Diagnosis not present

## 2020-08-20 DIAGNOSIS — E785 Hyperlipidemia, unspecified: Secondary | ICD-10-CM | POA: Diagnosis not present

## 2020-08-20 DIAGNOSIS — N3000 Acute cystitis without hematuria: Secondary | ICD-10-CM | POA: Diagnosis not present

## 2020-08-20 DIAGNOSIS — Z0001 Encounter for general adult medical examination with abnormal findings: Secondary | ICD-10-CM | POA: Diagnosis not present

## 2020-08-20 DIAGNOSIS — Z124 Encounter for screening for malignant neoplasm of cervix: Secondary | ICD-10-CM | POA: Diagnosis not present

## 2020-08-20 DIAGNOSIS — E039 Hypothyroidism, unspecified: Secondary | ICD-10-CM | POA: Diagnosis not present

## 2020-10-07 DIAGNOSIS — N3000 Acute cystitis without hematuria: Secondary | ICD-10-CM | POA: Diagnosis not present

## 2020-10-07 DIAGNOSIS — K581 Irritable bowel syndrome with constipation: Secondary | ICD-10-CM | POA: Diagnosis not present

## 2020-10-07 DIAGNOSIS — Z0000 Encounter for general adult medical examination without abnormal findings: Secondary | ICD-10-CM | POA: Diagnosis not present

## 2020-10-07 DIAGNOSIS — Z83511 Family history of glaucoma: Secondary | ICD-10-CM | POA: Diagnosis not present

## 2020-10-07 DIAGNOSIS — Z8639 Personal history of other endocrine, nutritional and metabolic disease: Secondary | ICD-10-CM | POA: Diagnosis not present

## 2020-10-07 DIAGNOSIS — I951 Orthostatic hypotension: Secondary | ICD-10-CM | POA: Diagnosis not present

## 2020-10-07 DIAGNOSIS — H8110 Benign paroxysmal vertigo, unspecified ear: Secondary | ICD-10-CM | POA: Diagnosis not present

## 2020-10-07 DIAGNOSIS — Z0001 Encounter for general adult medical examination with abnormal findings: Secondary | ICD-10-CM | POA: Diagnosis not present

## 2020-10-07 DIAGNOSIS — E785 Hyperlipidemia, unspecified: Secondary | ICD-10-CM | POA: Diagnosis not present

## 2020-11-17 DIAGNOSIS — H5200 Hypermetropia, unspecified eye: Secondary | ICD-10-CM | POA: Diagnosis not present

## 2020-11-17 DIAGNOSIS — Z0100 Encounter for examination of eyes and vision without abnormal findings: Secondary | ICD-10-CM | POA: Diagnosis not present

## 2020-11-22 DIAGNOSIS — R82998 Other abnormal findings in urine: Secondary | ICD-10-CM | POA: Diagnosis not present

## 2020-11-22 DIAGNOSIS — R300 Dysuria: Secondary | ICD-10-CM | POA: Diagnosis not present

## 2020-11-22 DIAGNOSIS — I951 Orthostatic hypotension: Secondary | ICD-10-CM | POA: Diagnosis not present

## 2020-11-22 DIAGNOSIS — Z0000 Encounter for general adult medical examination without abnormal findings: Secondary | ICD-10-CM | POA: Diagnosis not present

## 2020-11-22 DIAGNOSIS — E785 Hyperlipidemia, unspecified: Secondary | ICD-10-CM | POA: Diagnosis not present

## 2020-11-22 DIAGNOSIS — H8110 Benign paroxysmal vertigo, unspecified ear: Secondary | ICD-10-CM | POA: Diagnosis not present

## 2020-11-22 DIAGNOSIS — Z0001 Encounter for general adult medical examination with abnormal findings: Secondary | ICD-10-CM | POA: Diagnosis not present

## 2020-11-22 DIAGNOSIS — Z83511 Family history of glaucoma: Secondary | ICD-10-CM | POA: Diagnosis not present

## 2020-11-22 DIAGNOSIS — N3000 Acute cystitis without hematuria: Secondary | ICD-10-CM | POA: Diagnosis not present

## 2020-11-22 DIAGNOSIS — Z8639 Personal history of other endocrine, nutritional and metabolic disease: Secondary | ICD-10-CM | POA: Diagnosis not present

## 2020-11-22 DIAGNOSIS — N390 Urinary tract infection, site not specified: Secondary | ICD-10-CM | POA: Diagnosis not present

## 2020-11-22 DIAGNOSIS — K581 Irritable bowel syndrome with constipation: Secondary | ICD-10-CM | POA: Diagnosis not present

## 2020-11-22 DIAGNOSIS — M545 Low back pain, unspecified: Secondary | ICD-10-CM | POA: Diagnosis not present

## 2020-11-27 DIAGNOSIS — M545 Low back pain, unspecified: Secondary | ICD-10-CM | POA: Diagnosis not present

## 2020-11-27 DIAGNOSIS — E7849 Other hyperlipidemia: Secondary | ICD-10-CM | POA: Diagnosis not present

## 2020-11-27 DIAGNOSIS — R42 Dizziness and giddiness: Secondary | ICD-10-CM | POA: Diagnosis not present

## 2020-11-27 DIAGNOSIS — I10 Essential (primary) hypertension: Secondary | ICD-10-CM | POA: Diagnosis not present

## 2020-11-27 DIAGNOSIS — R300 Dysuria: Secondary | ICD-10-CM | POA: Diagnosis not present

## 2020-11-27 DIAGNOSIS — N390 Urinary tract infection, site not specified: Secondary | ICD-10-CM | POA: Diagnosis not present

## 2020-11-27 DIAGNOSIS — H4010X0 Unspecified open-angle glaucoma, stage unspecified: Secondary | ICD-10-CM | POA: Diagnosis not present

## 2020-11-27 DIAGNOSIS — R82998 Other abnormal findings in urine: Secondary | ICD-10-CM | POA: Diagnosis not present

## 2020-12-27 DIAGNOSIS — R42 Dizziness and giddiness: Secondary | ICD-10-CM | POA: Diagnosis not present

## 2020-12-27 DIAGNOSIS — E7849 Other hyperlipidemia: Secondary | ICD-10-CM | POA: Diagnosis not present

## 2020-12-27 DIAGNOSIS — I10 Essential (primary) hypertension: Secondary | ICD-10-CM | POA: Diagnosis not present

## 2020-12-27 DIAGNOSIS — N390 Urinary tract infection, site not specified: Secondary | ICD-10-CM | POA: Diagnosis not present

## 2020-12-27 DIAGNOSIS — H4010X0 Unspecified open-angle glaucoma, stage unspecified: Secondary | ICD-10-CM | POA: Diagnosis not present

## 2020-12-27 DIAGNOSIS — R300 Dysuria: Secondary | ICD-10-CM | POA: Diagnosis not present

## 2021-01-26 DIAGNOSIS — I10 Essential (primary) hypertension: Secondary | ICD-10-CM | POA: Diagnosis not present

## 2021-01-26 DIAGNOSIS — E1165 Type 2 diabetes mellitus with hyperglycemia: Secondary | ICD-10-CM | POA: Diagnosis not present

## 2021-02-10 DIAGNOSIS — H2513 Age-related nuclear cataract, bilateral: Secondary | ICD-10-CM | POA: Diagnosis not present

## 2021-02-10 DIAGNOSIS — H401131 Primary open-angle glaucoma, bilateral, mild stage: Secondary | ICD-10-CM | POA: Diagnosis not present

## 2021-02-15 DIAGNOSIS — E1165 Type 2 diabetes mellitus with hyperglycemia: Secondary | ICD-10-CM | POA: Diagnosis not present

## 2021-02-15 DIAGNOSIS — E7849 Other hyperlipidemia: Secondary | ICD-10-CM | POA: Diagnosis not present

## 2021-02-15 DIAGNOSIS — I10 Essential (primary) hypertension: Secondary | ICD-10-CM | POA: Diagnosis not present

## 2021-02-15 DIAGNOSIS — E785 Hyperlipidemia, unspecified: Secondary | ICD-10-CM | POA: Diagnosis not present

## 2021-02-15 DIAGNOSIS — E782 Mixed hyperlipidemia: Secondary | ICD-10-CM | POA: Diagnosis not present

## 2021-02-15 DIAGNOSIS — Z8639 Personal history of other endocrine, nutritional and metabolic disease: Secondary | ICD-10-CM | POA: Diagnosis not present

## 2021-02-15 DIAGNOSIS — R7301 Impaired fasting glucose: Secondary | ICD-10-CM | POA: Diagnosis not present

## 2021-02-15 DIAGNOSIS — E039 Hypothyroidism, unspecified: Secondary | ICD-10-CM | POA: Diagnosis not present

## 2021-02-16 DIAGNOSIS — R300 Dysuria: Secondary | ICD-10-CM | POA: Diagnosis not present

## 2021-02-16 DIAGNOSIS — Z0000 Encounter for general adult medical examination without abnormal findings: Secondary | ICD-10-CM | POA: Diagnosis not present

## 2021-02-16 DIAGNOSIS — N3000 Acute cystitis without hematuria: Secondary | ICD-10-CM | POA: Diagnosis not present

## 2021-02-16 DIAGNOSIS — H8110 Benign paroxysmal vertigo, unspecified ear: Secondary | ICD-10-CM | POA: Diagnosis not present

## 2021-02-16 DIAGNOSIS — E785 Hyperlipidemia, unspecified: Secondary | ICD-10-CM | POA: Diagnosis not present

## 2021-02-16 DIAGNOSIS — Z0001 Encounter for general adult medical examination with abnormal findings: Secondary | ICD-10-CM | POA: Diagnosis not present

## 2021-02-16 DIAGNOSIS — N390 Urinary tract infection, site not specified: Secondary | ICD-10-CM | POA: Diagnosis not present

## 2021-02-16 DIAGNOSIS — K581 Irritable bowel syndrome with constipation: Secondary | ICD-10-CM | POA: Diagnosis not present

## 2021-02-16 DIAGNOSIS — I951 Orthostatic hypotension: Secondary | ICD-10-CM | POA: Diagnosis not present

## 2021-02-18 DIAGNOSIS — H8110 Benign paroxysmal vertigo, unspecified ear: Secondary | ICD-10-CM | POA: Diagnosis not present

## 2021-02-18 DIAGNOSIS — K581 Irritable bowel syndrome with constipation: Secondary | ICD-10-CM | POA: Diagnosis not present

## 2021-02-18 DIAGNOSIS — I951 Orthostatic hypotension: Secondary | ICD-10-CM | POA: Diagnosis not present

## 2021-02-18 DIAGNOSIS — E039 Hypothyroidism, unspecified: Secondary | ICD-10-CM | POA: Diagnosis not present

## 2021-02-18 DIAGNOSIS — R7301 Impaired fasting glucose: Secondary | ICD-10-CM | POA: Diagnosis not present

## 2021-02-18 DIAGNOSIS — E782 Mixed hyperlipidemia: Secondary | ICD-10-CM | POA: Diagnosis not present

## 2021-02-18 DIAGNOSIS — I10 Essential (primary) hypertension: Secondary | ICD-10-CM | POA: Diagnosis not present

## 2021-02-18 DIAGNOSIS — R799 Abnormal finding of blood chemistry, unspecified: Secondary | ICD-10-CM | POA: Diagnosis not present

## 2021-02-18 DIAGNOSIS — H4010X0 Unspecified open-angle glaucoma, stage unspecified: Secondary | ICD-10-CM | POA: Diagnosis not present

## 2021-02-27 DIAGNOSIS — I10 Essential (primary) hypertension: Secondary | ICD-10-CM | POA: Diagnosis not present

## 2021-02-27 DIAGNOSIS — E1165 Type 2 diabetes mellitus with hyperglycemia: Secondary | ICD-10-CM | POA: Diagnosis not present

## 2021-03-23 DIAGNOSIS — H01001 Unspecified blepharitis right upper eyelid: Secondary | ICD-10-CM | POA: Diagnosis not present

## 2021-03-23 DIAGNOSIS — H401131 Primary open-angle glaucoma, bilateral, mild stage: Secondary | ICD-10-CM | POA: Diagnosis not present

## 2021-03-23 DIAGNOSIS — H01004 Unspecified blepharitis left upper eyelid: Secondary | ICD-10-CM | POA: Diagnosis not present

## 2021-03-23 DIAGNOSIS — H01002 Unspecified blepharitis right lower eyelid: Secondary | ICD-10-CM | POA: Diagnosis not present

## 2021-03-23 DIAGNOSIS — H2513 Age-related nuclear cataract, bilateral: Secondary | ICD-10-CM | POA: Diagnosis not present

## 2021-03-23 DIAGNOSIS — H01005 Unspecified blepharitis left lower eyelid: Secondary | ICD-10-CM | POA: Diagnosis not present

## 2021-04-01 DIAGNOSIS — E1165 Type 2 diabetes mellitus with hyperglycemia: Secondary | ICD-10-CM | POA: Diagnosis not present

## 2021-04-01 DIAGNOSIS — I10 Essential (primary) hypertension: Secondary | ICD-10-CM | POA: Diagnosis not present

## 2021-04-07 DIAGNOSIS — E039 Hypothyroidism, unspecified: Secondary | ICD-10-CM | POA: Diagnosis not present

## 2021-04-07 DIAGNOSIS — K5909 Other constipation: Secondary | ICD-10-CM | POA: Diagnosis not present

## 2021-04-07 DIAGNOSIS — E785 Hyperlipidemia, unspecified: Secondary | ICD-10-CM | POA: Diagnosis not present

## 2021-04-07 DIAGNOSIS — I10 Essential (primary) hypertension: Secondary | ICD-10-CM | POA: Diagnosis not present

## 2021-04-07 DIAGNOSIS — E1165 Type 2 diabetes mellitus with hyperglycemia: Secondary | ICD-10-CM | POA: Diagnosis not present

## 2021-04-21 DIAGNOSIS — K5909 Other constipation: Secondary | ICD-10-CM | POA: Diagnosis not present

## 2021-04-28 DIAGNOSIS — E1165 Type 2 diabetes mellitus with hyperglycemia: Secondary | ICD-10-CM | POA: Diagnosis not present

## 2021-04-28 DIAGNOSIS — I10 Essential (primary) hypertension: Secondary | ICD-10-CM | POA: Diagnosis not present

## 2021-06-22 DIAGNOSIS — H2513 Age-related nuclear cataract, bilateral: Secondary | ICD-10-CM | POA: Diagnosis not present

## 2021-06-22 DIAGNOSIS — H01002 Unspecified blepharitis right lower eyelid: Secondary | ICD-10-CM | POA: Diagnosis not present

## 2021-06-22 DIAGNOSIS — H401131 Primary open-angle glaucoma, bilateral, mild stage: Secondary | ICD-10-CM | POA: Diagnosis not present

## 2021-06-22 DIAGNOSIS — H01001 Unspecified blepharitis right upper eyelid: Secondary | ICD-10-CM | POA: Diagnosis not present

## 2021-07-01 ENCOUNTER — Other Ambulatory Visit (HOSPITAL_COMMUNITY): Payer: Self-pay | Admitting: Internal Medicine

## 2021-07-01 DIAGNOSIS — Z1231 Encounter for screening mammogram for malignant neoplasm of breast: Secondary | ICD-10-CM

## 2021-07-29 DIAGNOSIS — I10 Essential (primary) hypertension: Secondary | ICD-10-CM | POA: Diagnosis not present

## 2021-07-29 DIAGNOSIS — E1165 Type 2 diabetes mellitus with hyperglycemia: Secondary | ICD-10-CM | POA: Diagnosis not present

## 2021-08-08 ENCOUNTER — Ambulatory Visit (HOSPITAL_COMMUNITY): Payer: Medicare HMO

## 2021-08-10 ENCOUNTER — Encounter (HOSPITAL_COMMUNITY): Payer: Self-pay

## 2021-08-10 ENCOUNTER — Ambulatory Visit (HOSPITAL_COMMUNITY)
Admission: RE | Admit: 2021-08-10 | Discharge: 2021-08-10 | Disposition: A | Discharge status: Home / Self Care | Payer: Medicare HMO | Source: Ambulatory Visit | Attending: Internal Medicine | Admitting: Internal Medicine

## 2021-08-10 ENCOUNTER — Other Ambulatory Visit: Payer: Self-pay

## 2021-08-10 DIAGNOSIS — Z1231 Encounter for screening mammogram for malignant neoplasm of breast: Secondary | ICD-10-CM | POA: Diagnosis not present

## 2021-08-22 ENCOUNTER — Other Ambulatory Visit (HOSPITAL_COMMUNITY): Payer: Self-pay | Admitting: Internal Medicine

## 2021-08-22 DIAGNOSIS — R928 Other abnormal and inconclusive findings on diagnostic imaging of breast: Secondary | ICD-10-CM

## 2021-08-23 ENCOUNTER — Other Ambulatory Visit: Payer: Self-pay

## 2021-08-23 ENCOUNTER — Ambulatory Visit (HOSPITAL_COMMUNITY)
Admission: RE | Admit: 2021-08-23 | Discharge: 2021-08-23 | Disposition: A | Discharge status: Home / Self Care | Payer: Medicare HMO | Source: Ambulatory Visit | Attending: Internal Medicine | Admitting: Internal Medicine

## 2021-08-23 DIAGNOSIS — R928 Other abnormal and inconclusive findings on diagnostic imaging of breast: Secondary | ICD-10-CM | POA: Diagnosis not present

## 2021-08-23 DIAGNOSIS — I10 Essential (primary) hypertension: Secondary | ICD-10-CM | POA: Diagnosis not present

## 2021-08-23 DIAGNOSIS — N6489 Other specified disorders of breast: Secondary | ICD-10-CM | POA: Diagnosis not present

## 2021-08-23 DIAGNOSIS — R922 Inconclusive mammogram: Secondary | ICD-10-CM | POA: Diagnosis not present

## 2021-08-30 DIAGNOSIS — I10 Essential (primary) hypertension: Secondary | ICD-10-CM | POA: Diagnosis not present

## 2021-08-30 DIAGNOSIS — E039 Hypothyroidism, unspecified: Secondary | ICD-10-CM | POA: Diagnosis not present

## 2021-08-30 DIAGNOSIS — N390 Urinary tract infection, site not specified: Secondary | ICD-10-CM | POA: Diagnosis not present

## 2021-08-30 DIAGNOSIS — R300 Dysuria: Secondary | ICD-10-CM | POA: Diagnosis not present

## 2021-08-30 DIAGNOSIS — E1165 Type 2 diabetes mellitus with hyperglycemia: Secondary | ICD-10-CM | POA: Diagnosis not present

## 2021-08-30 DIAGNOSIS — E785 Hyperlipidemia, unspecified: Secondary | ICD-10-CM | POA: Diagnosis not present

## 2021-08-30 DIAGNOSIS — Z0001 Encounter for general adult medical examination with abnormal findings: Secondary | ICD-10-CM | POA: Diagnosis not present

## 2021-11-19 IMAGING — MG MM DIGITAL DIAGNOSTIC UNILAT*L* W/ TOMO W/ CAD
4 series · 4 of 12 positions shown · non-contrast
Comparison: Previous exams.

CLINICAL DATA: Screening recall for left breast asymmetry seen on
the MLO view only.

EXAM:
DIGITAL DIAGNOSTIC UNILATERAL LEFT MAMMOGRAM WITH TOMOSYNTHESIS AND
CAD
TECHNIQUE: Left digital diagnostic mammography and breast tomosynthesis was
performed. The images were evaluated with computer-aided detection.

[L MLO synth-2D]
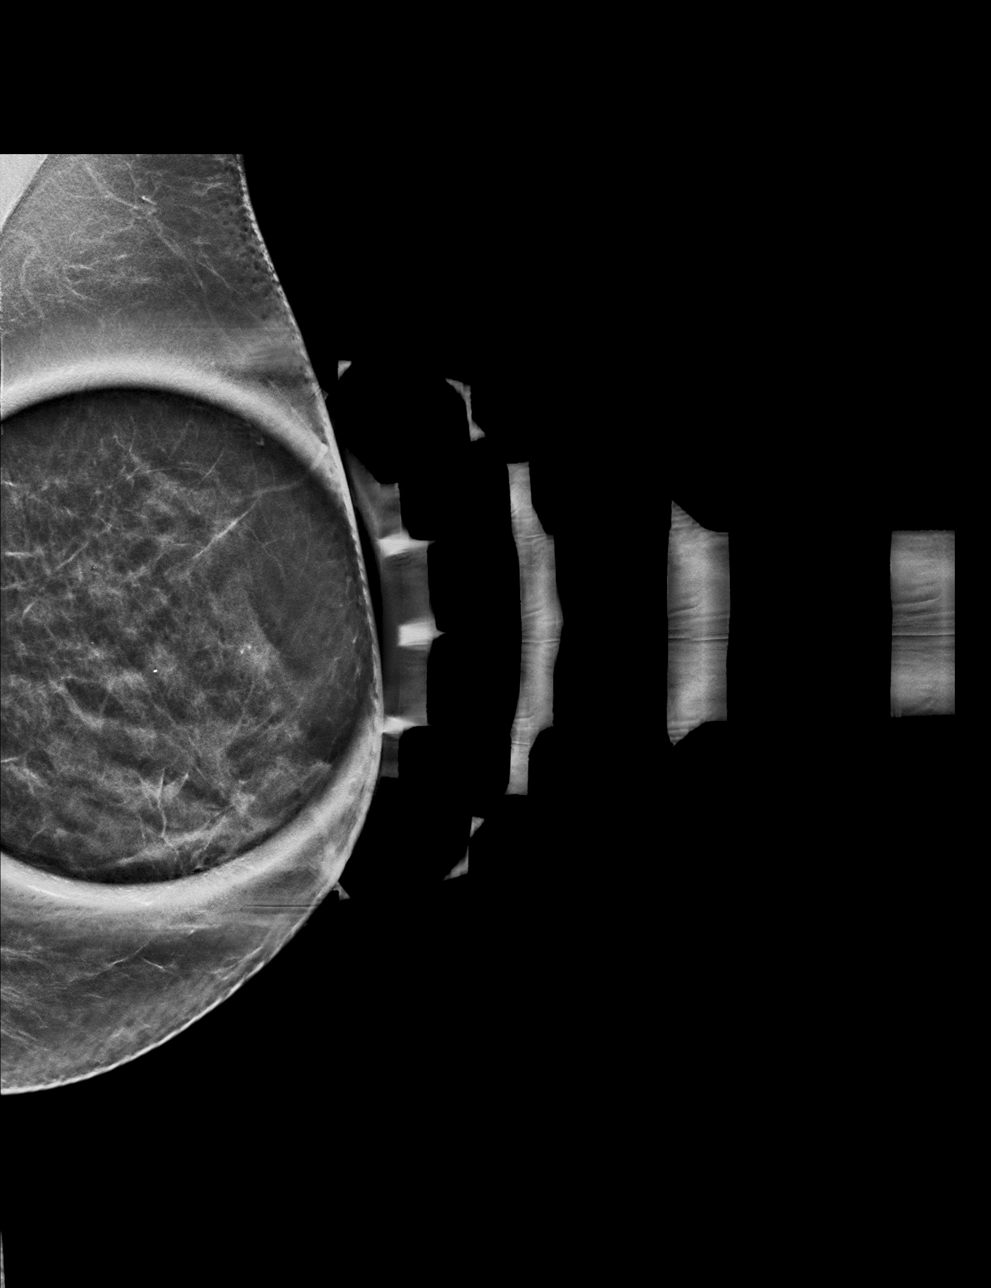

[L ML synth-2D]
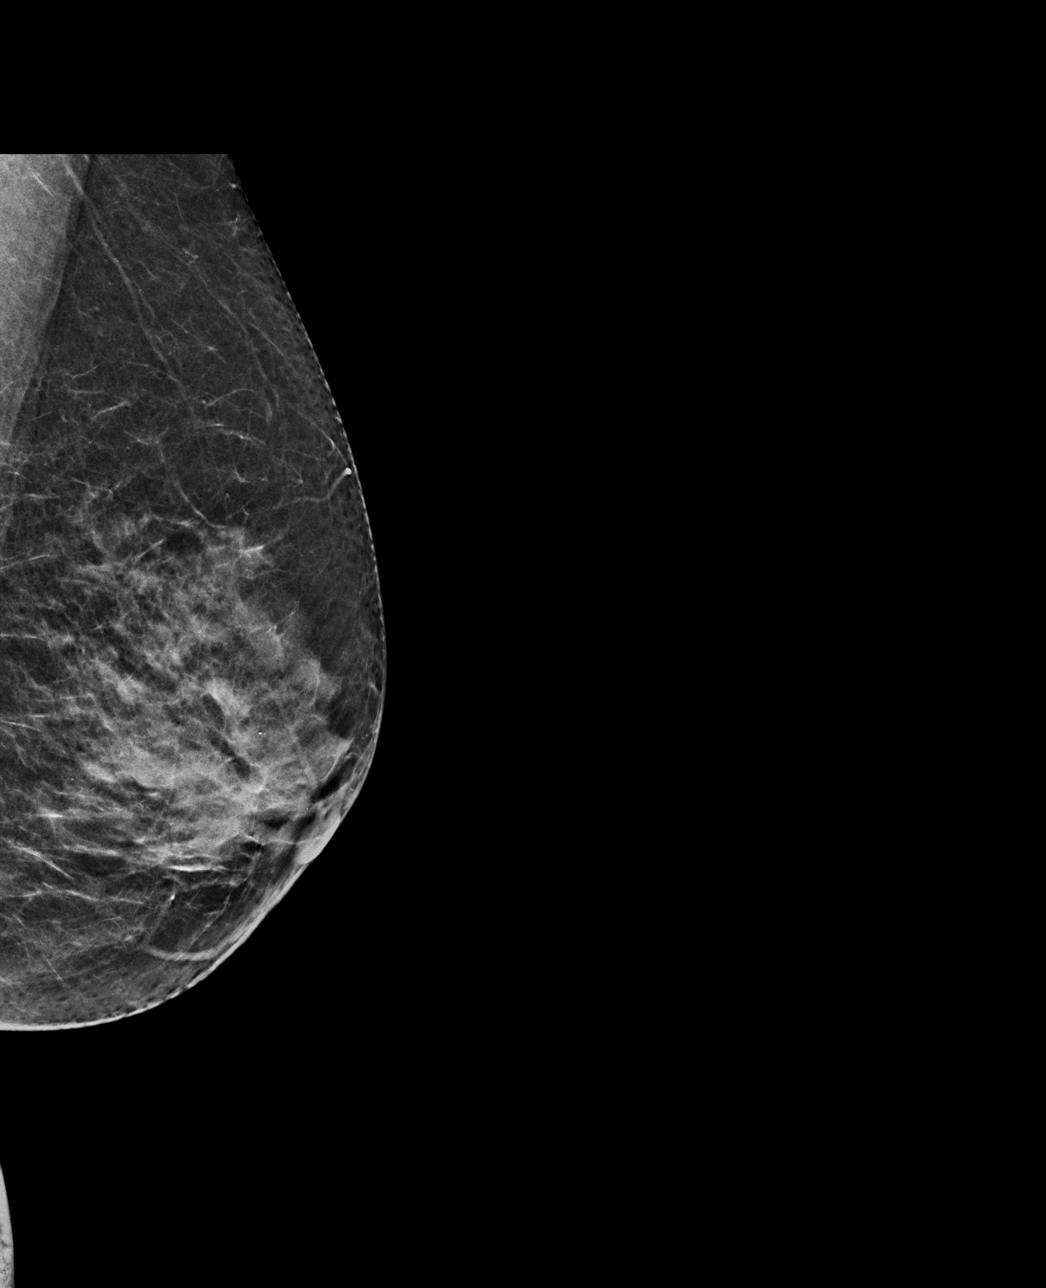

[L MLO tomo · tomo slice 25/48.0]
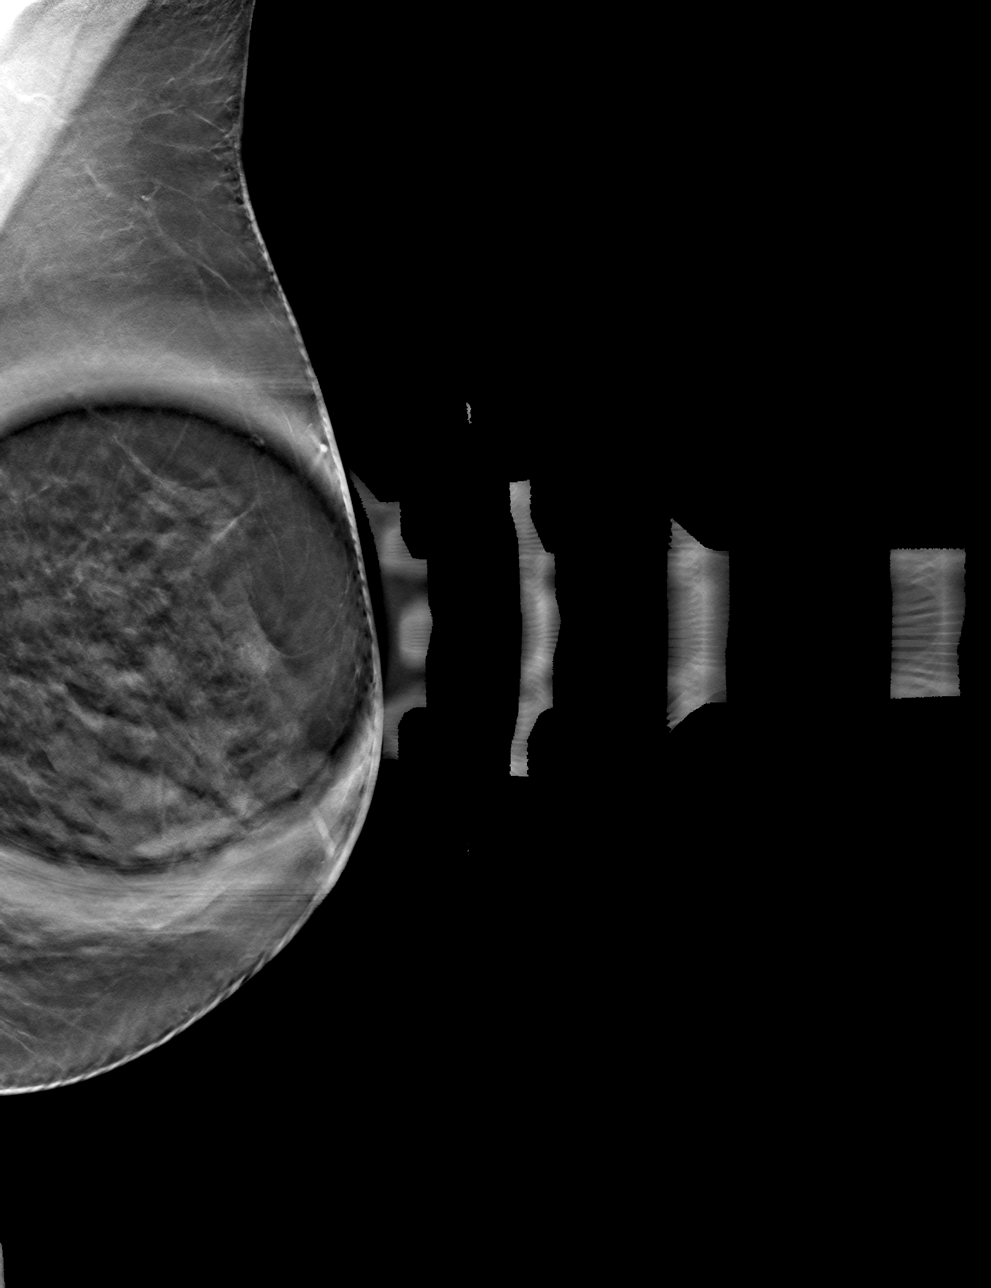

[L ML tomo · tomo slice 27/52.0]
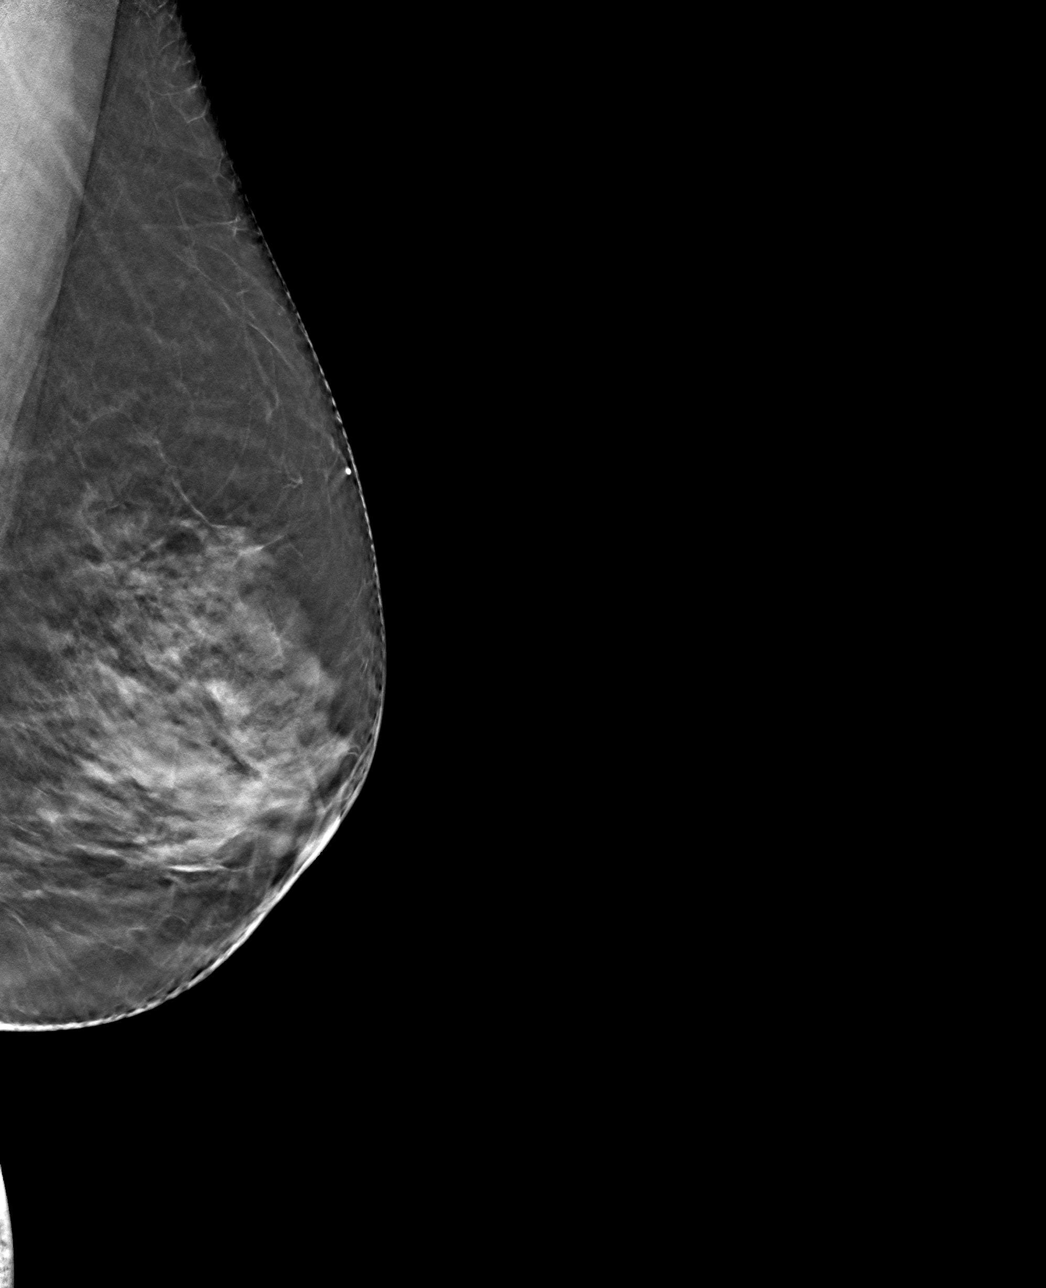

[4 of 12 positions shown; findings below may reference images not displayed]

ACR Breast Density Category c: The breast tissue is heterogeneously
dense, which may obscure small masses.
FINDINGS: Additional tomograms were performed of the left breast. The
initially questioned possible left breast asymmetry resolves on the
additional imaging with findings compatible with an area of
overlapping/dense fibroglandular tissue. There is no mammographic
evidence of malignancy in the left breast.
IMPRESSION: No mammographic evidence of malignancy in the left breast.

RECOMMENDATION:
Screening mammogram in one year.(Code:[id number])

I have discussed the findings and recommendations with the patient.
If applicable, a reminder letter will be sent to the patient
regarding the next appointment.

BI-RADS CATEGORY  1: Negative.

## 2022-02-22 DIAGNOSIS — I10 Essential (primary) hypertension: Secondary | ICD-10-CM | POA: Diagnosis not present

## 2022-02-22 DIAGNOSIS — E1165 Type 2 diabetes mellitus with hyperglycemia: Secondary | ICD-10-CM | POA: Diagnosis not present

## 2022-02-27 DIAGNOSIS — I10 Essential (primary) hypertension: Secondary | ICD-10-CM | POA: Diagnosis not present

## 2022-02-27 DIAGNOSIS — E1165 Type 2 diabetes mellitus with hyperglycemia: Secondary | ICD-10-CM | POA: Diagnosis not present

## 2022-02-27 DIAGNOSIS — E039 Hypothyroidism, unspecified: Secondary | ICD-10-CM | POA: Diagnosis not present

## 2022-02-27 DIAGNOSIS — E785 Hyperlipidemia, unspecified: Secondary | ICD-10-CM | POA: Diagnosis not present

## 2022-07-27 ENCOUNTER — Other Ambulatory Visit (HOSPITAL_COMMUNITY): Payer: Self-pay | Admitting: Internal Medicine

## 2022-08-16 ENCOUNTER — Other Ambulatory Visit (HOSPITAL_COMMUNITY): Payer: Self-pay | Admitting: Family Medicine

## 2022-08-16 DIAGNOSIS — Z1231 Encounter for screening mammogram for malignant neoplasm of breast: Secondary | ICD-10-CM

## 2022-08-23 ENCOUNTER — Ambulatory Visit (HOSPITAL_COMMUNITY)
Admission: RE | Admit: 2022-08-23 | Discharge: 2022-08-23 | Disposition: A | Discharge status: Home / Self Care | Payer: Medicare HMO | Source: Ambulatory Visit | Attending: Family Medicine | Admitting: Family Medicine

## 2022-08-23 DIAGNOSIS — Z1231 Encounter for screening mammogram for malignant neoplasm of breast: Secondary | ICD-10-CM | POA: Diagnosis not present

## 2022-08-23 DIAGNOSIS — E039 Hypothyroidism, unspecified: Secondary | ICD-10-CM | POA: Diagnosis not present

## 2022-08-23 DIAGNOSIS — I10 Essential (primary) hypertension: Secondary | ICD-10-CM | POA: Diagnosis not present

## 2022-08-23 DIAGNOSIS — E1165 Type 2 diabetes mellitus with hyperglycemia: Secondary | ICD-10-CM | POA: Diagnosis not present

## 2022-08-30 DIAGNOSIS — I10 Essential (primary) hypertension: Secondary | ICD-10-CM | POA: Diagnosis not present

## 2022-08-30 DIAGNOSIS — L659 Nonscarring hair loss, unspecified: Secondary | ICD-10-CM | POA: Diagnosis not present

## 2022-08-30 DIAGNOSIS — L409 Psoriasis, unspecified: Secondary | ICD-10-CM | POA: Diagnosis not present

## 2022-08-30 DIAGNOSIS — R944 Abnormal results of kidney function studies: Secondary | ICD-10-CM | POA: Diagnosis not present

## 2022-08-30 DIAGNOSIS — R809 Proteinuria, unspecified: Secondary | ICD-10-CM | POA: Diagnosis not present

## 2022-08-30 DIAGNOSIS — E1165 Type 2 diabetes mellitus with hyperglycemia: Secondary | ICD-10-CM | POA: Diagnosis not present

## 2022-08-30 DIAGNOSIS — E785 Hyperlipidemia, unspecified: Secondary | ICD-10-CM | POA: Diagnosis not present

## 2022-08-30 DIAGNOSIS — E039 Hypothyroidism, unspecified: Secondary | ICD-10-CM | POA: Diagnosis not present

## 2022-10-10 DIAGNOSIS — R5383 Other fatigue: Secondary | ICD-10-CM | POA: Diagnosis not present

## 2022-10-10 DIAGNOSIS — K5909 Other constipation: Secondary | ICD-10-CM | POA: Diagnosis not present

## 2022-10-10 DIAGNOSIS — R42 Dizziness and giddiness: Secondary | ICD-10-CM | POA: Diagnosis not present

## 2022-12-27 DIAGNOSIS — I10 Essential (primary) hypertension: Secondary | ICD-10-CM | POA: Diagnosis not present

## 2022-12-27 DIAGNOSIS — E039 Hypothyroidism, unspecified: Secondary | ICD-10-CM | POA: Diagnosis not present

## 2022-12-27 DIAGNOSIS — E1165 Type 2 diabetes mellitus with hyperglycemia: Secondary | ICD-10-CM | POA: Diagnosis not present

## 2022-12-27 DIAGNOSIS — E785 Hyperlipidemia, unspecified: Secondary | ICD-10-CM | POA: Diagnosis not present

## 2023-01-02 DIAGNOSIS — L659 Nonscarring hair loss, unspecified: Secondary | ICD-10-CM | POA: Diagnosis not present

## 2023-01-02 DIAGNOSIS — I10 Essential (primary) hypertension: Secondary | ICD-10-CM | POA: Diagnosis not present

## 2023-01-02 DIAGNOSIS — Z0001 Encounter for general adult medical examination with abnormal findings: Secondary | ICD-10-CM | POA: Diagnosis not present

## 2023-01-02 DIAGNOSIS — E039 Hypothyroidism, unspecified: Secondary | ICD-10-CM | POA: Diagnosis not present

## 2023-01-02 DIAGNOSIS — E785 Hyperlipidemia, unspecified: Secondary | ICD-10-CM | POA: Diagnosis not present

## 2023-01-02 DIAGNOSIS — L409 Psoriasis, unspecified: Secondary | ICD-10-CM | POA: Diagnosis not present

## 2023-01-02 DIAGNOSIS — R944 Abnormal results of kidney function studies: Secondary | ICD-10-CM | POA: Diagnosis not present

## 2023-01-02 DIAGNOSIS — R809 Proteinuria, unspecified: Secondary | ICD-10-CM | POA: Diagnosis not present

## 2023-01-02 DIAGNOSIS — E1165 Type 2 diabetes mellitus with hyperglycemia: Secondary | ICD-10-CM | POA: Diagnosis not present

## 2023-02-14 DIAGNOSIS — H5203 Hypermetropia, bilateral: Secondary | ICD-10-CM | POA: Diagnosis not present

## 2023-05-10 DIAGNOSIS — H01004 Unspecified blepharitis left upper eyelid: Secondary | ICD-10-CM | POA: Diagnosis not present

## 2023-05-10 DIAGNOSIS — H2513 Age-related nuclear cataract, bilateral: Secondary | ICD-10-CM | POA: Diagnosis not present

## 2023-05-10 DIAGNOSIS — H401131 Primary open-angle glaucoma, bilateral, mild stage: Secondary | ICD-10-CM | POA: Diagnosis not present

## 2023-05-10 DIAGNOSIS — H01001 Unspecified blepharitis right upper eyelid: Secondary | ICD-10-CM | POA: Diagnosis not present

## 2023-05-10 DIAGNOSIS — H01002 Unspecified blepharitis right lower eyelid: Secondary | ICD-10-CM | POA: Diagnosis not present

## 2023-05-10 DIAGNOSIS — H01005 Unspecified blepharitis left lower eyelid: Secondary | ICD-10-CM | POA: Diagnosis not present

## 2023-06-21 DIAGNOSIS — H01001 Unspecified blepharitis right upper eyelid: Secondary | ICD-10-CM | POA: Diagnosis not present

## 2023-06-21 DIAGNOSIS — H2513 Age-related nuclear cataract, bilateral: Secondary | ICD-10-CM | POA: Diagnosis not present

## 2023-06-21 DIAGNOSIS — H401131 Primary open-angle glaucoma, bilateral, mild stage: Secondary | ICD-10-CM | POA: Diagnosis not present

## 2023-06-21 DIAGNOSIS — H01002 Unspecified blepharitis right lower eyelid: Secondary | ICD-10-CM | POA: Diagnosis not present

## 2023-06-29 DIAGNOSIS — E039 Hypothyroidism, unspecified: Secondary | ICD-10-CM | POA: Diagnosis not present

## 2023-06-29 DIAGNOSIS — E1165 Type 2 diabetes mellitus with hyperglycemia: Secondary | ICD-10-CM | POA: Diagnosis not present

## 2023-06-29 DIAGNOSIS — I10 Essential (primary) hypertension: Secondary | ICD-10-CM | POA: Diagnosis not present

## 2023-07-05 DIAGNOSIS — I10 Essential (primary) hypertension: Secondary | ICD-10-CM | POA: Diagnosis not present

## 2023-07-05 DIAGNOSIS — Z23 Encounter for immunization: Secondary | ICD-10-CM | POA: Diagnosis not present

## 2023-07-05 DIAGNOSIS — E1165 Type 2 diabetes mellitus with hyperglycemia: Secondary | ICD-10-CM | POA: Diagnosis not present

## 2023-07-05 DIAGNOSIS — R809 Proteinuria, unspecified: Secondary | ICD-10-CM | POA: Diagnosis not present

## 2023-07-05 DIAGNOSIS — E785 Hyperlipidemia, unspecified: Secondary | ICD-10-CM | POA: Diagnosis not present

## 2023-07-05 DIAGNOSIS — E039 Hypothyroidism, unspecified: Secondary | ICD-10-CM | POA: Diagnosis not present

## 2023-07-05 DIAGNOSIS — D509 Iron deficiency anemia, unspecified: Secondary | ICD-10-CM | POA: Diagnosis not present

## 2023-07-05 DIAGNOSIS — L659 Nonscarring hair loss, unspecified: Secondary | ICD-10-CM | POA: Diagnosis not present

## 2023-07-05 DIAGNOSIS — R110 Nausea: Secondary | ICD-10-CM | POA: Diagnosis not present

## 2023-07-10 ENCOUNTER — Encounter: Payer: Self-pay | Admitting: *Deleted

## 2023-08-16 ENCOUNTER — Other Ambulatory Visit (HOSPITAL_COMMUNITY): Payer: Self-pay | Admitting: Internal Medicine

## 2023-08-16 DIAGNOSIS — Z1231 Encounter for screening mammogram for malignant neoplasm of breast: Secondary | ICD-10-CM

## 2023-08-27 ENCOUNTER — Ambulatory Visit (HOSPITAL_COMMUNITY)
Admission: RE | Admit: 2023-08-27 | Discharge: 2023-08-27 | Disposition: A | Discharge status: Home / Self Care | Payer: Medicare HMO | Source: Ambulatory Visit | Attending: Internal Medicine | Admitting: Internal Medicine

## 2023-08-27 DIAGNOSIS — Z1231 Encounter for screening mammogram for malignant neoplasm of breast: Secondary | ICD-10-CM | POA: Diagnosis not present

## 2023-09-17 DIAGNOSIS — H2513 Age-related nuclear cataract, bilateral: Secondary | ICD-10-CM | POA: Diagnosis not present

## 2023-09-17 DIAGNOSIS — H01002 Unspecified blepharitis right lower eyelid: Secondary | ICD-10-CM | POA: Diagnosis not present

## 2023-09-17 DIAGNOSIS — H01001 Unspecified blepharitis right upper eyelid: Secondary | ICD-10-CM | POA: Diagnosis not present

## 2023-09-17 DIAGNOSIS — H401131 Primary open-angle glaucoma, bilateral, mild stage: Secondary | ICD-10-CM | POA: Diagnosis not present

## 2024-01-02 DIAGNOSIS — E1165 Type 2 diabetes mellitus with hyperglycemia: Secondary | ICD-10-CM | POA: Diagnosis not present

## 2024-01-02 DIAGNOSIS — I10 Essential (primary) hypertension: Secondary | ICD-10-CM | POA: Diagnosis not present

## 2024-01-02 DIAGNOSIS — E039 Hypothyroidism, unspecified: Secondary | ICD-10-CM | POA: Diagnosis not present

## 2024-01-08 DIAGNOSIS — E785 Hyperlipidemia, unspecified: Secondary | ICD-10-CM | POA: Diagnosis not present

## 2024-01-08 DIAGNOSIS — R944 Abnormal results of kidney function studies: Secondary | ICD-10-CM | POA: Diagnosis not present

## 2024-01-08 DIAGNOSIS — E039 Hypothyroidism, unspecified: Secondary | ICD-10-CM | POA: Diagnosis not present

## 2024-01-08 DIAGNOSIS — Z0001 Encounter for general adult medical examination with abnormal findings: Secondary | ICD-10-CM | POA: Diagnosis not present

## 2024-01-08 DIAGNOSIS — Z0000 Encounter for general adult medical examination without abnormal findings: Secondary | ICD-10-CM | POA: Diagnosis not present

## 2024-01-08 DIAGNOSIS — Z23 Encounter for immunization: Secondary | ICD-10-CM | POA: Diagnosis not present

## 2024-01-08 DIAGNOSIS — I10 Essential (primary) hypertension: Secondary | ICD-10-CM | POA: Diagnosis not present

## 2024-01-08 DIAGNOSIS — R809 Proteinuria, unspecified: Secondary | ICD-10-CM | POA: Diagnosis not present

## 2024-01-08 DIAGNOSIS — E1165 Type 2 diabetes mellitus with hyperglycemia: Secondary | ICD-10-CM | POA: Diagnosis not present

## 2024-01-09 ENCOUNTER — Encounter: Payer: Self-pay | Admitting: *Deleted

## 2024-01-14 DIAGNOSIS — H04123 Dry eye syndrome of bilateral lacrimal glands: Secondary | ICD-10-CM | POA: Diagnosis not present

## 2024-01-14 DIAGNOSIS — H401131 Primary open-angle glaucoma, bilateral, mild stage: Secondary | ICD-10-CM | POA: Diagnosis not present

## 2024-01-14 DIAGNOSIS — H01005 Unspecified blepharitis left lower eyelid: Secondary | ICD-10-CM | POA: Diagnosis not present

## 2024-01-14 DIAGNOSIS — H01004 Unspecified blepharitis left upper eyelid: Secondary | ICD-10-CM | POA: Diagnosis not present

## 2024-01-14 DIAGNOSIS — H01001 Unspecified blepharitis right upper eyelid: Secondary | ICD-10-CM | POA: Diagnosis not present

## 2024-01-14 DIAGNOSIS — H01002 Unspecified blepharitis right lower eyelid: Secondary | ICD-10-CM | POA: Diagnosis not present

## 2024-01-14 DIAGNOSIS — H2513 Age-related nuclear cataract, bilateral: Secondary | ICD-10-CM | POA: Diagnosis not present

## 2024-02-05 DIAGNOSIS — E039 Hypothyroidism, unspecified: Secondary | ICD-10-CM | POA: Diagnosis not present

## 2024-02-21 ENCOUNTER — Encounter: Payer: Self-pay | Admitting: *Deleted

## 2024-04-10 ENCOUNTER — Encounter: Payer: Self-pay | Admitting: *Deleted

## 2024-04-10 ENCOUNTER — Telehealth: Payer: Self-pay | Admitting: *Deleted

## 2024-04-10 ENCOUNTER — Encounter: Payer: Self-pay | Admitting: Internal Medicine

## 2024-04-10 ENCOUNTER — Ambulatory Visit: Admitting: Internal Medicine

## 2024-04-10 ENCOUNTER — Other Ambulatory Visit: Payer: Self-pay | Admitting: *Deleted

## 2024-04-10 VITALS — BP 137/70 | HR 89 | Temp 97.9°F | Ht 62.0 in | Wt 125.0 lb

## 2024-04-10 DIAGNOSIS — Z860101 Personal history of adenomatous and serrated colon polyps: Secondary | ICD-10-CM | POA: Diagnosis not present

## 2024-04-10 DIAGNOSIS — K5904 Chronic idiopathic constipation: Secondary | ICD-10-CM | POA: Diagnosis not present

## 2024-04-10 DIAGNOSIS — Z860100 Personal history of colon polyps, unspecified: Secondary | ICD-10-CM

## 2024-04-10 MED ORDER — NA SULFATE-K SULFATE-MG SULF 17.5-3.13-1.6 GM/177ML PO SOLN: ORAL | 0 refills | Status: DC

## 2024-04-10 NOTE — Progress Notes (Signed)
 Primary Care Physician:  Newt Barefoot, FNP Primary Gastroenterologist:  Dr. Mordechai April  Chief Complaint  Patient presents with   Colonoscopy    Colonoscopy screening. No problems      HPI:   Jenna Wade is a 73 y.o. female who presents to clinic today to discuss colonoscopy.  Referred by PCP Dr. Del Favia.  History of adenomatous colon polyps-colonoscopy 2015 with 12 mm polyp removed at the hepatic flexure.  Fragments of tubular adenoma on pathology.  Recommended recall in 3 years.  Patient denies any family history of colorectal malignancy.  No melena hematochezia.  No abdominal pain.  No unintentional weight loss.  Chronic idiopathic constipation-currently taking OTC stool softener as well as as needed enemas.  Symptoms not ideally controlled.  Notes occasional straining.  States she was on Linzess  many years ago which helped her symptoms.  Interested in going back on this medication.  Denies any upper GI symptoms including heartburn, reflux, dysphagia/odynophagia, epigastric or chest pain.  Past Medical History:  Diagnosis Date   Bronchitis    Burning with urination 05/07/2014   Constipation 04/21/2014   Hematuria 05/07/2014   History of mammogram    Hyperlipidemia    Hypertension    Hypothyroidism    Thyroid  disease     Past Surgical History:  Procedure Laterality Date   COLONOSCOPY N/A 05/28/2014   Procedure: COLONOSCOPY;  Surgeon: Alyce Jubilee, MD;  Location: [facility];  Service: Endoscopy;  Laterality: N/A;  8:00   EXPLORATORY laparoscopy     remote past   TUBAL LIGATION      Current Outpatient Medications  Medication Sig Dispense Refill   ibuprofen (ADVIL,MOTRIN) 200 MG tablet Take 400 mg by mouth every 6 (six) hours as needed. Pain      latanoprost (XALATAN) 0.005 % ophthalmic solution Place 1 drop into both eyes at bedtime.      levothyroxine (SYNTHROID, LEVOTHROID) 75 MCG tablet Take 75 mcg by mouth daily.     lisinopril-hydrochlorothiazide (PRINZIDE,ZESTORETIC)  20-12.5 MG per tablet Take 1 tablet by mouth daily.       pravastatin (PRAVACHOL) 40 MG tablet Take 40 mg by mouth daily.      ciprofloxacin  (CIPRO ) 500 MG tablet Take 1 tablet (500 mg total) by mouth 2 (two) times daily. (Patient not taking: Reported on 04/10/2024) 14 tablet 0   fluconazole  (DIFLUCAN ) 150 MG tablet Take 1 now and repat 1 in 3 days if needed (Patient not taking: Reported on 04/10/2024) 2 tablet 1   Fluocinolone Acetonide Scalp 0.01 % OIL Prn (Patient not taking: Reported on 04/10/2024)     No current facility-administered medications for this visit.    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   Other  11/01/2011    Family History  Problem Relation Age of Onset   Hypertension Mother    Hyperlipidemia Mother    Other Mother        pacemaker   Hypertension Father    Heart disease Father    Heart attack Father    Hypertension Sister    Hypertension Daughter    Hypertension Son    Hypertension Brother    Hypertension Sister    Hypertension Sister    Hypertension Sister    Hypertension Sister    Lupus Sister    Colon cancer Neg Hx     Social History   Socioeconomic History   Marital status: Married  Spouse name: Not on file   Number of children: Not on file   Years of education: Not on file   Highest education level: Not on file  Occupational History   Not on file  Tobacco Use   Smoking status: Never   Smokeless tobacco: Never   Tobacco comments:    Never smoked  Vaping Use   Vaping status: Never Used  Substance and Sexual Activity   Alcohol use: Yes    Comment: special occ   Drug use: No   Sexual activity: Yes    Partners: Male    Birth control/protection: Post-menopausal, Surgical    Comment: tubal  Other Topics Concern   Not on file  Social History Narrative   Not on file   Social Drivers of Health   Financial Resource Strain: Not on file  Food Insecurity:  Not on file  Transportation Needs: Not on file  Physical Activity: Not on file  Stress: Not on file  Social Connections: Not on file  Intimate Partner Violence: Not on file    Subjective: Review of Systems  Constitutional:  Negative for chills and fever.  HENT:  Negative for congestion and hearing loss.   Eyes:  Negative for blurred vision and double vision.  Respiratory:  Negative for cough and shortness of breath.   Cardiovascular:  Negative for chest pain and palpitations.  Gastrointestinal:  Positive for constipation. Negative for abdominal pain, blood in stool, diarrhea, heartburn, melena and vomiting.  Genitourinary:  Negative for dysuria and urgency.  Musculoskeletal:  Negative for joint pain and myalgias.  Skin:  Negative for itching and rash.  Neurological:  Negative for dizziness and headaches.  Psychiatric/Behavioral:  Negative for depression. The patient is not nervous/anxious.        Objective: BP 137/70 (BP Location: Right Arm, Patient Position: Sitting, Cuff Size: Normal)   Pulse 89   Temp 97.9 F (36.6 C) (Temporal)   Ht 5' 2 (1.575 m)   Wt 125 lb (56.7 kg)   LMP 10/31/2009   BMI 22.86 kg/m  Physical Exam Constitutional:      Appearance: Normal appearance.  HENT:     Head: Normocephalic and atraumatic.   Eyes:     Extraocular Movements: Extraocular movements intact.     Conjunctiva/sclera: Conjunctivae normal.    Cardiovascular:     Rate and Rhythm: Normal rate and regular rhythm.  Pulmonary:     Effort: Pulmonary effort is normal.     Breath sounds: Normal breath sounds.  Abdominal:     General: Bowel sounds are normal.     Palpations: Abdomen is soft.   Musculoskeletal:        General: No swelling. Normal range of motion.     Cervical back: Normal range of motion and neck supple.   Skin:    General: Skin is warm and dry.     Coloration: Skin is not jaundiced.   Neurological:     General: No focal deficit present.     Mental Status:  She is alert and oriented to person, place, and time.   Psychiatric:        Mood and Affect: Mood normal.        Behavior: Behavior normal.      Assessment/Plan:  1.  History of adenomatous colon polyps-we will schedule for surveillance colonoscopy today. The risks including infection, bleed, or perforation as well as benefits, limitations, alternatives and imponderables have been reviewed with the patient. Questions have been answered. All parties  agreeable.  2.  Chronic idiopathic constipation-symptoms not ideally controlled on OTC stool softeners and enemas.  Will give samples of Linzess  145 mcg daily and see how she does.  Counseled on room to increase or decrease dose depending how she responds.  Counseled on initial washout.  She understands.  Call with update in 1 week and I can send a formal prescription if improved.  Thank you Dr. Del Favia for the kind referral. 04/10/2024 9:41 AM   Disclaimer: This note was dictated with voice recognition software. Similar sounding words can inadvertently be transcribed and may not be corrected upon review.

## 2024-04-10 NOTE — Addendum Note (Signed)
 Addended by: Alvester Johnson on: 04/10/2024 10:21 AM   Modules accepted: Orders

## 2024-04-10 NOTE — Telephone Encounter (Signed)
 [employer] PA: Approved Authorization #[id number]  Tracking #[id number] Dates of service 05/27/2024 - 08/26/2024

## 2024-04-10 NOTE — Patient Instructions (Signed)
 We will schedule you for colonoscopy given your history of polyp prior.  For your constipation, I am going to give you samples of Linzess  145 mcg daily.  We have room to increase or decrease dose depending how you respond.  Let me know in a week how you are doing and I can send in formal prescription.  You may have an initial washout period with this medication.  Linzess  works best when taken once a day every day, on an empty stomach, at least 30 minutes before your first meal of the day.  When Linzess  is taken daily as directed:  *Constipation relief is typically felt in about a week *IBS-C patients may begin to experience relief from belly pain and overall abdominal symptoms (pain, discomfort, and bloating) in about 1 week,   with symptoms typically improving over 12 weeks.  Diarrhea may occur in the first 2 weeks -keep taking it.  The diarrhea should go away and you should start having normal, complete, full bowel movements. It may be helpful to start treatment when you can be near the comfort of your own bathroom, such as a weekend.   It was very nice meeting you today.  Dr. Mordechai April

## 2024-04-17 DIAGNOSIS — H401131 Primary open-angle glaucoma, bilateral, mild stage: Secondary | ICD-10-CM | POA: Diagnosis not present

## 2024-05-08 DIAGNOSIS — E039 Hypothyroidism, unspecified: Secondary | ICD-10-CM | POA: Diagnosis not present

## 2024-05-08 DIAGNOSIS — D509 Iron deficiency anemia, unspecified: Secondary | ICD-10-CM | POA: Diagnosis not present

## 2024-05-08 DIAGNOSIS — E1165 Type 2 diabetes mellitus with hyperglycemia: Secondary | ICD-10-CM | POA: Diagnosis not present

## 2024-05-08 DIAGNOSIS — I10 Essential (primary) hypertension: Secondary | ICD-10-CM | POA: Diagnosis not present

## 2024-05-14 DIAGNOSIS — D509 Iron deficiency anemia, unspecified: Secondary | ICD-10-CM | POA: Diagnosis not present

## 2024-05-14 DIAGNOSIS — E039 Hypothyroidism, unspecified: Secondary | ICD-10-CM | POA: Diagnosis not present

## 2024-05-14 DIAGNOSIS — I10 Essential (primary) hypertension: Secondary | ICD-10-CM | POA: Diagnosis not present

## 2024-05-14 DIAGNOSIS — E785 Hyperlipidemia, unspecified: Secondary | ICD-10-CM | POA: Diagnosis not present

## 2024-05-14 DIAGNOSIS — E1165 Type 2 diabetes mellitus with hyperglycemia: Secondary | ICD-10-CM | POA: Diagnosis not present

## 2024-05-14 DIAGNOSIS — R944 Abnormal results of kidney function studies: Secondary | ICD-10-CM | POA: Diagnosis not present

## 2024-05-14 DIAGNOSIS — L409 Psoriasis, unspecified: Secondary | ICD-10-CM | POA: Diagnosis not present

## 2024-05-14 DIAGNOSIS — R809 Proteinuria, unspecified: Secondary | ICD-10-CM | POA: Diagnosis not present

## 2024-05-14 DIAGNOSIS — R110 Nausea: Secondary | ICD-10-CM | POA: Diagnosis not present

## 2024-05-26 ENCOUNTER — Other Ambulatory Visit: Payer: Self-pay

## 2024-05-26 DIAGNOSIS — Z860100 Personal history of colon polyps, unspecified: Secondary | ICD-10-CM | POA: Diagnosis not present

## 2024-05-27 ENCOUNTER — Encounter (HOSPITAL_COMMUNITY)
Admission: RE | Disposition: A | Discharge status: Home / Self Care | Payer: Self-pay | Source: Home / Self Care | Attending: Internal Medicine

## 2024-05-27 ENCOUNTER — Ambulatory Visit (HOSPITAL_BASED_OUTPATIENT_CLINIC_OR_DEPARTMENT_OTHER): Payer: Self-pay | Admitting: Anesthesiology

## 2024-05-27 ENCOUNTER — Ambulatory Visit (HOSPITAL_COMMUNITY)
Admission: RE | Admit: 2024-05-27 | Discharge: 2024-05-27 | Disposition: A | Discharge status: Home / Self Care | Attending: Internal Medicine | Admitting: Internal Medicine

## 2024-05-27 ENCOUNTER — Encounter (HOSPITAL_COMMUNITY): Payer: Self-pay | Admitting: Internal Medicine

## 2024-05-27 ENCOUNTER — Other Ambulatory Visit: Payer: Self-pay

## 2024-05-27 ENCOUNTER — Encounter (HOSPITAL_COMMUNITY): Payer: Self-pay | Admitting: Anesthesiology

## 2024-05-27 DIAGNOSIS — D120 Benign neoplasm of cecum: Secondary | ICD-10-CM

## 2024-05-27 DIAGNOSIS — D123 Benign neoplasm of transverse colon: Secondary | ICD-10-CM

## 2024-05-27 DIAGNOSIS — Z79899 Other long term (current) drug therapy: Secondary | ICD-10-CM | POA: Diagnosis not present

## 2024-05-27 DIAGNOSIS — K649 Unspecified hemorrhoids: Secondary | ICD-10-CM

## 2024-05-27 DIAGNOSIS — Z1211 Encounter for screening for malignant neoplasm of colon: Secondary | ICD-10-CM

## 2024-05-27 DIAGNOSIS — K635 Polyp of colon: Secondary | ICD-10-CM

## 2024-05-27 DIAGNOSIS — Z860100 Personal history of colon polyps, unspecified: Secondary | ICD-10-CM | POA: Diagnosis not present

## 2024-05-27 DIAGNOSIS — K648 Other hemorrhoids: Secondary | ICD-10-CM | POA: Diagnosis not present

## 2024-05-27 DIAGNOSIS — E039 Hypothyroidism, unspecified: Secondary | ICD-10-CM | POA: Diagnosis not present

## 2024-05-27 DIAGNOSIS — E785 Hyperlipidemia, unspecified: Secondary | ICD-10-CM | POA: Diagnosis not present

## 2024-05-27 DIAGNOSIS — D122 Benign neoplasm of ascending colon: Secondary | ICD-10-CM

## 2024-05-27 DIAGNOSIS — L818 Other specified disorders of pigmentation: Secondary | ICD-10-CM

## 2024-05-27 DIAGNOSIS — I10 Essential (primary) hypertension: Secondary | ICD-10-CM | POA: Diagnosis not present

## 2024-05-27 DIAGNOSIS — Z860101 Personal history of adenomatous and serrated colon polyps: Secondary | ICD-10-CM

## 2024-05-27 DIAGNOSIS — Z79890 Hormone replacement therapy (postmenopausal): Secondary | ICD-10-CM | POA: Diagnosis not present

## 2024-05-27 HISTORY — PX: COLONOSCOPY: SHX5424

## 2024-05-27 LAB — BASIC METABOLIC PANEL WITH GFR
BUN/Creatinine Ratio: 11 — ABNORMAL LOW (ref 12–28)
BUN: 9 mg/dL (ref 8–27)
CO2: 21 mmol/L (ref 20–29)
Calcium: 10.2 mg/dL (ref 8.7–10.3)
Chloride: 103 mmol/L (ref 96–106)
Creatinine, Ser: 0.85 mg/dL (ref 0.57–1.00)
Glucose: 91 mg/dL (ref 70–99)
Potassium: 4.5 mmol/L (ref 3.5–5.2)
Sodium: 140 mmol/L (ref 134–144)
eGFR: 73 mL/min/1.73 (ref >59)

## 2024-05-27 SURGERY — COLONOSCOPY
Anesthesia: General

## 2024-05-27 MED ORDER — LINACLOTIDE 145 MCG PO CAPS: 145.0000 ug | ORAL_CAPSULE | Freq: Every day | ORAL | 3 refills | Status: AC

## 2024-05-27 MED ORDER — LACTATED RINGERS IV SOLN: INTRAVENOUS | Status: DC

## 2024-05-27 MED ORDER — PROPOFOL 500 MG/50ML IV EMUL
INTRAVENOUS | Status: AC
  Filled 2024-05-27: qty 50

## 2024-05-27 MED ORDER — PROPOFOL 500 MG/50ML IV EMUL
INTRAVENOUS | Status: DC | PRN
  Administered 2024-05-27: 60 mg via INTRAVENOUS
  Administered 2024-05-27: 150 ug/kg/min via INTRAVENOUS

## 2024-05-27 NOTE — OR Nursing (Signed)
 Pt in post op room after procedure. Pt awake and alert, VSS, pt assisted with dressing in her clothes. Slip grip socks on. Pt assisted to chair with arms. Pt instructed not to get up by Devere Lodge, RN. While waiting for husband to bring vehicle to discharge area, nurses Devere, RN and [employer], RN stepped outside of room. Within seconds, noise heard and patient was lying on the floor, face down, glasses and hat found above head. No LOC. Pt complained of hitting her teeth. Asked patient what happened, pt stated she got up and got real dizzy and weak. Assessed patient before moving patient. Patient denied any other complaints. Assisted patient to roll on her back and then into a sitting position on the floor. Assisted patient to standing position and moved to chair. Assessed mouth, bleeding noted from inside of bottom lip and top lip. This nurse felt of teeth to assess if any were loose, none noted. Bleeding controlled which was minimal. Ice applied. Reassessed vital signs, stable. Pt complained of pressure on her upper front two teeth. Noted to have a small pink area on left cheek where glasses touch. Dr. Kendell and Dr. Cindie assessed patient. Offered pt to go to ED for further evaluation, pt and spouse declined at this time. Per doctor, advised to follow up with primary care physician and or dentist if needed. Both patient and spouse verbalized understanding.

## 2024-05-27 NOTE — Progress Notes (Signed)
 Asked to evaluate patient after a fall.  Apparently she was dressed and ready to leave when she stood up and fell.  She hit her mouth on the floor, resulting in some small lacerations to both lips and tooth pain.  She has been instructed to apply ice and let us  know if her condition doesn't improve by tomorrow.

## 2024-05-27 NOTE — Progress Notes (Signed)
 Patient presented for colonoscopy today.  Procedure uneventful.  In postop, prior to leaving, patient reportedly stood up unassisted without RN present and suffered a fall.  She hit her mouth on the floor which resulted in small lacerations to her bottom and upper lips. No LOC.  Mild bleeding which has stopped by the time I evaluated her.  Denies any neurological symptoms.  Teeth evaluated, no loose teeth appreciated.  Patient does note some soreness.  Recommend she apply ice at home.  I offered evaluation in the ER and patient would like to go home.  ER precautions given.

## 2024-05-27 NOTE — H&P (Signed)
 Primary Care Physician:  Aura Portal, FNP Primary Gastroenterologist:  Dr. Cindie  Pre-Procedure History & Physical: HPI:  Jenna Wade is a 73 y.o. female is here for a colonoscopy to be performed for surveillance purposes, personal history of adenomatous colon polyps in 2015  Past Medical History:  Diagnosis Date   Bronchitis    Burning with urination 05/07/2014   Constipation 04/21/2014   Hematuria 05/07/2014   History of mammogram    Hyperlipidemia    Hypertension    Hypothyroidism    Thyroid  disease     Past Surgical History:  Procedure Laterality Date   COLONOSCOPY N/A 05/28/2014   Procedure: COLONOSCOPY;  Surgeon: Margo LITTIE Haddock, MD;  Location: [facility];  Service: Endoscopy;  Laterality: N/A;  8:00   EXPLORATORY laparoscopy     remote past   TUBAL LIGATION      Prior to Admission medications   Medication Sig Start Date End Date Taking? Authorizing Provider  ibuprofen (ADVIL,MOTRIN) 200 MG tablet Take 400 mg by mouth every 6 (six) hours as needed. Pain    Yes [provider]  latanoprost (XALATAN) 0.005 % ophthalmic solution Place 1 drop into both eyes at bedtime.  12/04/18  Yes [provider]  levothyroxine (SYNTHROID, LEVOTHROID) 75 MCG tablet Take 75 mcg by mouth daily.   Yes [provider]  lisinopril-hydrochlorothiazide (PRINZIDE,ZESTORETIC) 20-12.5 MG per tablet Take 1 tablet by mouth daily.     Yes [provider]  pravastatin (PRAVACHOL) 40 MG tablet Take 40 mg by mouth daily.  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   Other  11/01/2011    Family History  Problem Relation Age of Onset   Hypertension Mother    Hyperlipidemia Mother    Other Mother        pacemaker    Hypertension Father    Heart disease Father    Heart attack Father    Hypertension Sister    Hypertension Daughter    Hypertension Son    Hypertension Brother    Hypertension Sister    Hypertension Sister    Hypertension Sister    Hypertension Sister    Lupus Sister    Colon cancer Neg Hx     Social History   Socioeconomic History   Marital status: Married    Spouse name: Not on file   Number of children: Not on file   Years of education: Not on file   Highest education level: Not on file  Occupational History   Not on file  Tobacco Use   Smoking status: Never   Smokeless tobacco: Never   Tobacco comments:    Never smoked  Vaping Use   Vaping status: Never Used  Substance and Sexual Activity   Alcohol use: Yes    Comment: special occ   Drug use: No   Sexual activity: Yes    Partners: Male    Birth control/protection: Post-menopausal, Surgical    Comment: tubal  Other Topics Concern   Not on file  Social History Narrative   Not on file   Social Drivers of Health   Financial Resource Strain: Not on file  Food Insecurity: Not on file  Transportation Needs: Not on file  Physical Activity: Not on file  Stress:  Not on file  Social Connections: Not on file  Intimate Partner Violence: Not on file    Review of Systems: See HPI, otherwise negative ROS  Physical Exam: Vital signs in last 24 hours: Temp:  [98.1 F (36.7 C)] 98.1 F (36.7 C) (07/29 0756) Pulse Rate:  [76] 76 (07/29 0756) Resp:  [21] 21 (07/29 0756) BP: (160)/(69) 160/69 (07/29 0756) SpO2:  [98 %] 98 % (07/29 0756) Weight:  [56.7 kg] 56.7 kg (07/29 0756)   General:   Alert,  Well-developed, well-nourished, pleasant and cooperative in NAD Head:  Normocephalic and atraumatic. Eyes:  Sclera clear, no icterus.   Conjunctiva pink. Ears:  Normal auditory acuity. Nose:  No deformity, discharge,  or lesions. Msk:  Symmetrical without gross deformities. Normal posture. Extremities:  Without  clubbing or edema. Neurologic:  Alert and  oriented x4;  grossly normal neurologically. Skin:  Intact without significant lesions or rashes. Psych:  Alert and cooperative. Normal mood and affect.  Impression/Plan: Jenna Wade is here for a colonoscopy to be performed for surveillance purposes, personal history of adenomatous colon polyps in 2015  The risks of the procedure including infection, bleed, or perforation as well as benefits, limitations, alternatives and imponderables have been reviewed with the patient. Questions have been answered. All parties agreeable.

## 2024-05-27 NOTE — Transfer of Care (Signed)
 Immediate Anesthesia Transfer of Care Note  Patient: Jenna Wade  Procedure(s) Performed: COLONOSCOPY  Patient Location: Endoscopy Unit  Anesthesia Type:General  Level of Consciousness: awake, alert , and oriented  Airway & Oxygen Therapy: Patient Spontanous Breathing  Post-op Assessment: Report given to RN, Post -op Vital signs reviewed and stable, Patient moving all extremities X 4, and Patient able to stick tongue midline  Post vital signs: Reviewed and stable  Last Vitals:  Vitals Value Taken Time  BP 111/59 05/27/24 09:06  Temp 36.9 C 05/27/24 09:06  Pulse 53 05/27/24 09:06  Resp 15 05/27/24 09:06  SpO2 100 % 05/27/24 09:06    Last Pain:  Vitals:   05/27/24 0906  TempSrc: Oral  PainSc:       Patients Stated Pain Goal: 9 (05/27/24 0756)  Complications: No notable events documented.

## 2024-05-27 NOTE — Anesthesia Preprocedure Evaluation (Signed)
 Anesthesia Evaluation  Patient identified by MRN, date of birth, ID band Patient awake    Reviewed: Allergy & Precautions, H&P , NPO status , Patient's Chart, lab work & pertinent test results, reviewed documented beta blocker date and time   Airway Mallampati: II  TM Distance: >3 FB Neck ROM: full    Dental no notable dental hx.    Pulmonary neg pulmonary ROS   Pulmonary exam normal breath sounds clear to auscultation       Cardiovascular Exercise Tolerance: Good hypertension,  Rhythm:regular Rate:Normal     Neuro/Psych negative neurological ROS  negative psych ROS   GI/Hepatic negative GI ROS, Neg liver ROS,,,  Endo/Other  Hypothyroidism    Renal/GU negative Renal ROS  negative genitourinary   Musculoskeletal   Abdominal   Peds  Hematology negative hematology ROS (+)   Anesthesia Other Findings   Reproductive/Obstetrics negative OB ROS                              Anesthesia Physical Anesthesia Plan  ASA: 2  Anesthesia Plan: General   Post-op Pain Management:    Induction:   PONV Risk Score and Plan: Propofol  infusion  Airway Management Planned:   Additional Equipment:   Intra-op Plan:   Post-operative Plan:   Informed Consent: I have reviewed the patients History and Physical, chart, labs and discussed the procedure including the risks, benefits and alternatives for the proposed anesthesia with the patient or authorized representative who has indicated his/her understanding and acceptance.     Dental Advisory Given  Plan Discussed with: CRNA  Anesthesia Plan Comments:         Anesthesia Quick Evaluation

## 2024-05-27 NOTE — Op Note (Signed)
 [facility] Patient Name: Jenna Wade Procedure Date: 05/27/2024 8:32 AM MRN: [id number] Date of Birth: July 01, 1951 Attending MD: Carlin POUR. Cindie , [place], [id number] CSN: [id number] Age: 73 Admit Type: Outpatient Procedure:                Colonoscopy Indications:              Surveillance: Personal history of adenomatous                            polyps on last colonoscopy > 5 years ago Providers:                Carlin POUR. Cindie, DO, Jon LABOR. Gerome RN, RN,                            [place] Wilson, Technician, Daphne Rosalynn Christians,                            Pensions consultant Referring MD:              Medicines:                See the Anesthesia note for documentation of the                            administered medications Complications:            No immediate complications. Estimated Blood Loss:     Estimated blood loss was minimal. Procedure:                Pre-Anesthesia Assessment:                           - The anesthesia plan was to use monitored                            anesthesia care (MAC).                           After obtaining informed consent, the colonoscope                            was passed under direct vision. Throughout the                            procedure, the patient's blood pressure, pulse, and                            oxygen saturations were monitored continuously. The                            [id number]-HQ190L ([id number]) scope was introduced through                            the anus and advanced to the the cecum, identified                            by appendiceal orifice and  ileocecal valve. The                            colonoscopy was performed without difficulty. The                            patient tolerated the procedure well. The quality                            of the bowel preparation was evaluated using the                            BBPS [facility] Bowel Preparation Scale) with scores                            of: Right Colon = 2  (minor amount of residual                            staining, small fragments of stool and/or opaque                            liquid, but mucosa seen well), Transverse Colon = 2                            (minor amount of residual staining, small fragments                            of stool and/or opaque liquid, but mucosa seen                            well) and Left Colon = 2 (minor amount of residual                            staining, small fragments of stool and/or opaque                            liquid, but mucosa seen well). The total BBPS score                            equals 6. The quality of the bowel preparation was                            good. Scope In: 8:43:43 AM Scope Out: 9:03:56 AM Scope Withdrawal Time: 0 hours 16 minutes 53 seconds  Total Procedure Duration: 0 hours 20 minutes 13 seconds  Findings:      Non-bleeding internal hemorrhoids were found.      Three sessile polyps were found in the ascending colon and cecum. The       polyps were 4 to 7 mm in size. These polyps were removed with a cold       snare. Resection and retrieval were complete.      Two sessile polyps were found in the transverse colon. The polyps were 3       to  10 mm in size. These polyps were removed with a cold snare. Resection       and retrieval were complete.      A tattoo was seen at the hepatic flexure. The tattoo site appeared       normal.      A moderate amount of semi-liquid stool was found in the entire colon,       making visualization difficult. Lavage of the area was performed using       copious amounts of sterile water , resulting in clearance with good       visualization. Impression:               - Non-bleeding internal hemorrhoids.                           - Three 4 to 7 mm polyps in the ascending colon and                            in the cecum, removed with a cold snare. Resected                            and retrieved.                           - Two 3 to  10 mm polyps in the transverse colon,                            removed with a cold snare. Resected and retrieved.                           - A tattoo was seen at the hepatic flexure. The                            tattoo site appeared normal.                           - Stool in the entire examined colon. Moderate Sedation:      Per Anesthesia Care Recommendation:           - Patient has a contact number available for                            emergencies. The signs and symptoms of potential                            delayed complications were discussed with the                            patient. Return to normal activities tomorrow.                            Written discharge instructions were provided to the                            patient.                           -  Resume previous diet.                           - Continue present medications.                           - Await pathology results.                           - Repeat colonoscopy in 3 years for surveillance.                           - Return to GI clinic in 6 months. Procedure Code(s):        --- Professional ---                           [phone], Colonoscopy, flexible; with removal of                            tumor(s), polyp(s), or other lesion(s) by snare                            technique Diagnosis Code(s):        --- Professional ---                           Z86.010, Personal history of colonic polyps                           D12.2, Benign neoplasm of ascending colon                           D12.0, Benign neoplasm of cecum                           D12.3, Benign neoplasm of transverse colon (hepatic                            flexure or splenic flexure)                           K64.8, Other hemorrhoids CPT copyright 2022 American Medical Association. All rights reserved. The codes documented in this report are preliminary and upon coder review may  be revised to meet current compliance  requirements. Carlin POUR. Cindie, DO Carlin POUR. Cindie, DO 05/27/2024 9:07:59 AM This report has been signed electronically. Number of Addenda: 0

## 2024-05-27 NOTE — Discharge Instructions (Addendum)
  Colonoscopy Discharge Instructions  Read the instructions outlined below and refer to this sheet in the next few weeks. These discharge instructions provide you with general information on caring for yourself after you leave the hospital. Your doctor may also give you specific instructions. While your treatment has been planned according to the most current medical practices available, unavoidable complications occasionally occur.   ACTIVITY You may resume your regular activity, but move at a slower pace for the next 24 hours.  Take frequent rest periods for the next 24 hours.  Walking will help get rid of the air and reduce the bloated feeling in your belly (abdomen).  No driving for 24 hours (because of the medicine (anesthesia) used during the test).   Do not sign any important legal documents or operate any machinery for 24 hours (because of the anesthesia used during the test).  NUTRITION Drink plenty of fluids.  You may resume your normal diet as instructed by your doctor.  Begin with a light meal and progress to your normal diet. Heavy or fried foods are harder to digest and may make you feel sick to your stomach (nauseated).  Avoid alcoholic beverages for 24 hours or as instructed.  MEDICATIONS You may resume your normal medications unless your doctor tells you otherwise.  WHAT YOU CAN EXPECT TODAY Some feelings of bloating in the abdomen.  Passage of more gas than usual.  Spotting of blood in your stool or on the toilet paper.  IF YOU HAD POLYPS REMOVED DURING THE COLONOSCOPY: No aspirin products for 7 days or as instructed.  No alcohol for 7 days or as instructed.  Eat a soft diet for the next 24 hours.  FINDING OUT THE RESULTS OF YOUR TEST Not all test results are available during your visit. If your test results are not back during the visit, make an appointment with your caregiver to find out the results. Do not assume everything is normal if you have not heard from your  caregiver or the medical facility. It is important for you to follow up on all of your test results.  SEEK IMMEDIATE MEDICAL ATTENTION IF: You have more than a spotting of blood in your stool.  Your belly is swollen (abdominal distention).  You are nauseated or vomiting.  You have a temperature over 101.  You have abdominal pain or discomfort that is severe or gets worse throughout the day.   Your colonoscopy revealed 5 polyp(s) which I removed successfully. Await pathology results, my office will contact you. I recommend repeating colonoscopy in 3 years for surveillance purposes.   Linzess  has been sent to your pharmacy  Follow up in GI office 6 months. Message sent to the office and will schedule this for you.   I hope you have a great rest of your week!  Carlin POUR. Cindie, D.O. Gastroenterology and Hepatology [facility]

## 2024-05-28 ENCOUNTER — Encounter (HOSPITAL_COMMUNITY): Payer: Self-pay | Admitting: Internal Medicine

## 2024-05-28 LAB — SURGICAL PATHOLOGY

## 2024-05-28 NOTE — Anesthesia Postprocedure Evaluation (Signed)
 Anesthesia Post Note  Patient: Jenna Wade  Procedure(s) Performed: COLONOSCOPY  Patient location during evaluation: Phase II Anesthesia Type: General Level of consciousness: awake Pain management: pain level controlled Vital Signs Assessment: post-procedure vital signs reviewed and stable Respiratory status: spontaneous breathing and respiratory function stable Cardiovascular status: blood pressure returned to baseline and stable Postop Assessment: no headache and no apparent nausea or vomiting Anesthetic complications: no Comments: Late entry   No notable events documented.   Last Vitals:  Vitals:   05/27/24 0906 05/27/24 0926  BP: (!) 111/59 (!) 154/69  Pulse: (!) 53 65  Resp: 15   Temp: 36.9 C   SpO2: 100% 99%    Last Pain:  Vitals:   05/27/24 0909  TempSrc:   PainSc: 0-No pain                 Yvonna JINNY Bosworth

## 2024-05-29 ENCOUNTER — Ambulatory Visit: Payer: Self-pay | Admitting: Internal Medicine

## 2024-06-02 NOTE — Progress Notes (Signed)
 done

## 2024-06-23 DIAGNOSIS — M542 Cervicalgia: Secondary | ICD-10-CM | POA: Diagnosis not present

## 2024-08-19 ENCOUNTER — Other Ambulatory Visit (HOSPITAL_COMMUNITY): Payer: Self-pay | Admitting: Internal Medicine

## 2024-08-19 DIAGNOSIS — Z1231 Encounter for screening mammogram for malignant neoplasm of breast: Secondary | ICD-10-CM

## 2024-09-04 ENCOUNTER — Encounter (HOSPITAL_COMMUNITY): Payer: Self-pay

## 2024-09-04 ENCOUNTER — Ambulatory Visit (HOSPITAL_COMMUNITY)
Admission: RE | Admit: 2024-09-04 | Discharge: 2024-09-04 | Disposition: A | Discharge status: Home / Self Care | Source: Ambulatory Visit | Attending: Internal Medicine | Admitting: Internal Medicine

## 2024-09-04 DIAGNOSIS — Z1231 Encounter for screening mammogram for malignant neoplasm of breast: Secondary | ICD-10-CM | POA: Diagnosis present

## 2024-09-09 DIAGNOSIS — E1165 Type 2 diabetes mellitus with hyperglycemia: Secondary | ICD-10-CM | POA: Diagnosis not present

## 2024-09-09 DIAGNOSIS — I10 Essential (primary) hypertension: Secondary | ICD-10-CM | POA: Diagnosis not present

## 2024-09-09 DIAGNOSIS — D509 Iron deficiency anemia, unspecified: Secondary | ICD-10-CM | POA: Diagnosis not present

## 2024-09-15 DIAGNOSIS — Z0000 Encounter for general adult medical examination without abnormal findings: Secondary | ICD-10-CM | POA: Diagnosis not present

## 2024-09-15 DIAGNOSIS — E039 Hypothyroidism, unspecified: Secondary | ICD-10-CM | POA: Diagnosis not present

## 2024-09-15 DIAGNOSIS — Z0001 Encounter for general adult medical examination with abnormal findings: Secondary | ICD-10-CM | POA: Diagnosis not present

## 2024-09-15 DIAGNOSIS — E785 Hyperlipidemia, unspecified: Secondary | ICD-10-CM | POA: Diagnosis not present

## 2024-09-15 DIAGNOSIS — E1165 Type 2 diabetes mellitus with hyperglycemia: Secondary | ICD-10-CM | POA: Diagnosis not present

## 2024-09-15 DIAGNOSIS — R809 Proteinuria, unspecified: Secondary | ICD-10-CM | POA: Diagnosis not present

## 2024-09-15 DIAGNOSIS — R944 Abnormal results of kidney function studies: Secondary | ICD-10-CM | POA: Diagnosis not present

## 2024-09-15 DIAGNOSIS — I10 Essential (primary) hypertension: Secondary | ICD-10-CM | POA: Diagnosis not present

## 2024-09-15 DIAGNOSIS — Z23 Encounter for immunization: Secondary | ICD-10-CM | POA: Diagnosis not present

## 2024-10-21 ENCOUNTER — Encounter: Payer: Self-pay | Admitting: Internal Medicine

## 2024-12-02 ENCOUNTER — Encounter: Payer: Self-pay | Admitting: *Deleted

## 2024-12-02 NOTE — Progress Notes (Signed)
 DELSY ETZKORN                                          MRN: [id number]   12/02/2024   The [facility] Quality Team Specialist reviewed this patient medical record for the purposes of chart review for care gap closure. The following were reviewed: chart review for care gap closure-controlling blood pressure.    [facility] Quality Team
